# Patient Record
Sex: Male | Born: 1985 | Race: White | Hispanic: No | Marital: Single | Smoking: Former smoker
Health system: Southern US, Community
[De-identification: ages and names within clinical notes are randomized; demographics above are authoritative.]

## PROBLEM LIST (undated history)

## (undated) HISTORY — PX: OTHER SURGICAL HISTORY: SHX169

---
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
## 2012-04-01 DIAGNOSIS — S21139A Puncture wound without foreign body of unspecified front wall of thorax without penetration into thoracic cavity, initial encounter: Secondary | ICD-10-CM | POA: Insufficient documentation

## 2012-04-01 DIAGNOSIS — S51012A Laceration without foreign body of left elbow, initial encounter: Secondary | ICD-10-CM | POA: Insufficient documentation

## 2012-04-03 DIAGNOSIS — F1210 Cannabis abuse, uncomplicated: Secondary | ICD-10-CM | POA: Insufficient documentation

## 2012-04-03 DIAGNOSIS — T07XXXA Unspecified multiple injuries, initial encounter: Secondary | ICD-10-CM | POA: Insufficient documentation

## 2012-04-03 DIAGNOSIS — D62 Acute posthemorrhagic anemia: Secondary | ICD-10-CM | POA: Insufficient documentation

## 2014-09-26 ENCOUNTER — Emergency Department (HOSPITAL_COMMUNITY)
Admission: EM | Admit: 2014-09-26 | Discharge: 2014-09-26 | Disposition: A | Discharge status: Home / Self Care | Payer: Self-pay | Attending: Emergency Medicine | Admitting: Emergency Medicine

## 2014-09-26 ENCOUNTER — Emergency Department (HOSPITAL_COMMUNITY): Payer: Self-pay

## 2014-09-26 ENCOUNTER — Encounter (HOSPITAL_COMMUNITY): Payer: Self-pay | Admitting: Emergency Medicine

## 2014-09-26 DIAGNOSIS — S31119A Laceration without foreign body of abdominal wall, unspecified quadrant without penetration into peritoneal cavity, initial encounter: Secondary | ICD-10-CM

## 2014-09-26 DIAGNOSIS — S31110A Laceration without foreign body of abdominal wall, right upper quadrant without penetration into peritoneal cavity, initial encounter: Secondary | ICD-10-CM | POA: Insufficient documentation

## 2014-09-26 DIAGNOSIS — F1910 Other psychoactive substance abuse, uncomplicated: Secondary | ICD-10-CM

## 2014-09-26 DIAGNOSIS — Z23 Encounter for immunization: Secondary | ICD-10-CM | POA: Insufficient documentation

## 2014-09-26 DIAGNOSIS — Y9389 Activity, other specified: Secondary | ICD-10-CM | POA: Insufficient documentation

## 2014-09-26 DIAGNOSIS — Z720 Tobacco use: Secondary | ICD-10-CM | POA: Insufficient documentation

## 2014-09-26 DIAGNOSIS — W270XXA Contact with workbench tool, initial encounter: Secondary | ICD-10-CM | POA: Insufficient documentation

## 2014-09-26 DIAGNOSIS — Y929 Unspecified place or not applicable: Secondary | ICD-10-CM | POA: Insufficient documentation

## 2014-09-26 DIAGNOSIS — Y998 Other external cause status: Secondary | ICD-10-CM | POA: Insufficient documentation

## 2014-09-26 LAB — RAPID URINE DRUG SCREEN, HOSP PERFORMED
Amphetamines: POSITIVE — AB
BARBITURATES: NOT DETECTED
Benzodiazepines: NOT DETECTED
Cocaine: NOT DETECTED
Opiates: POSITIVE — AB
Tetrahydrocannabinol: POSITIVE — AB

## 2014-09-26 LAB — PREPARE FRESH FROZEN PLASMA
Unit division: 0
Unit division: 0

## 2014-09-26 MED ORDER — ACETAMINOPHEN 500 MG PO TABS
1000.0000 mg | ORAL_TABLET | Freq: Once | ORAL | Status: AC
  Administered 2014-09-26: 1000 mg via ORAL
  Filled 2014-09-26: qty 2

## 2014-09-26 MED ORDER — SODIUM CHLORIDE 0.9 % IV BOLUS (SEPSIS)
1000.0000 mL | Freq: Once | INTRAVENOUS | Status: AC
  Administered 2014-09-26: 1000 mL via INTRAVENOUS

## 2014-09-26 MED ORDER — TETANUS-DIPHTH-ACELL PERTUSSIS 5-2.5-18.5 LF-MCG/0.5 IM SUSP
0.5000 mL | Freq: Once | INTRAMUSCULAR | Status: AC
  Administered 2014-09-26: 0.5 mL via INTRAMUSCULAR
  Filled 2014-09-26: qty 0.5
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
## 2014-09-26 NOTE — ED Notes (Signed)
Pt a/o x 4 on d/c with family. 

## 2014-09-26 NOTE — Discharge Instructions (Signed)

## 2014-09-26 NOTE — ED Notes (Signed)
Pt. Awoken to request urine sample. Pt. States that he is unable to pee at this time, pt. Is refusing use of in-and-out catheter at this time. MD made aware.
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
## 2015-08-26 DIAGNOSIS — T424X2A Poisoning by benzodiazepines, intentional self-harm, initial encounter: Secondary | ICD-10-CM | POA: Insufficient documentation

## 2016-06-14 ENCOUNTER — Encounter (HOSPITAL_COMMUNITY): Payer: Self-pay

## 2016-06-14 ENCOUNTER — Emergency Department (HOSPITAL_COMMUNITY): Payer: Self-pay

## 2016-06-14 ENCOUNTER — Encounter (HOSPITAL_COMMUNITY)
Admission: EM | Disposition: A | Discharge status: Home / Self Care | Payer: Self-pay | Source: Home / Self Care | Attending: Orthopedic Surgery

## 2016-06-14 ENCOUNTER — Observation Stay (HOSPITAL_COMMUNITY)
Admission: EM | Admit: 2016-06-14 | Discharge: 2016-06-15 | Disposition: A | Discharge status: Home / Self Care | Payer: Self-pay | Attending: Orthopedic Surgery | Admitting: Orthopedic Surgery

## 2016-06-14 ENCOUNTER — Emergency Department (HOSPITAL_COMMUNITY): Payer: Self-pay | Admitting: Critical Care Medicine

## 2016-06-14 DIAGNOSIS — S65112A Laceration of radial artery at wrist and hand level of left arm, initial encounter: Secondary | ICD-10-CM | POA: Insufficient documentation

## 2016-06-14 DIAGNOSIS — Z23 Encounter for immunization: Secondary | ICD-10-CM | POA: Insufficient documentation

## 2016-06-14 DIAGNOSIS — S61412A Laceration without foreign body of left hand, initial encounter: Principal | ICD-10-CM | POA: Insufficient documentation

## 2016-06-14 DIAGNOSIS — Y998 Other external cause status: Secondary | ICD-10-CM | POA: Insufficient documentation

## 2016-06-14 DIAGNOSIS — R05 Cough: Secondary | ICD-10-CM | POA: Insufficient documentation

## 2016-06-14 DIAGNOSIS — F17200 Nicotine dependence, unspecified, uncomplicated: Secondary | ICD-10-CM | POA: Insufficient documentation

## 2016-06-14 DIAGNOSIS — S64498A Injury of digital nerve of other finger, initial encounter: Secondary | ICD-10-CM | POA: Insufficient documentation

## 2016-06-14 DIAGNOSIS — Y92009 Unspecified place in unspecified non-institutional (private) residence as the place of occurrence of the external cause: Secondary | ICD-10-CM | POA: Insufficient documentation

## 2016-06-14 DIAGNOSIS — Y93E9 Activity, other interior property and clothing maintenance: Secondary | ICD-10-CM | POA: Insufficient documentation

## 2016-06-14 DIAGNOSIS — W25XXXA Contact with sharp glass, initial encounter: Secondary | ICD-10-CM | POA: Insufficient documentation

## 2016-06-14 HISTORY — PX: NERVE, TENDON AND ARTERY REPAIR: SHX5695

## 2016-06-14 LAB — CBC WITH DIFFERENTIAL/PLATELET
BASOS ABS: 0 10*3/uL (ref 0.0–0.1)
Basophils Relative: 0 %
EOS PCT: 3 %
Eosinophils Absolute: 0.2 10*3/uL (ref 0.0–0.7)
HCT: 33.5 % — ABNORMAL LOW (ref 39.0–52.0)
Hemoglobin: 11.2 g/dL — ABNORMAL LOW (ref 13.0–17.0)
LYMPHS PCT: 43 %
Lymphs Abs: 3.5 10*3/uL (ref 0.7–4.0)
MCH: 29.6 pg (ref 26.0–34.0)
MCHC: 33.4 g/dL (ref 30.0–36.0)
MCV: 88.4 fL (ref 78.0–100.0)
MONO ABS: 0.7 10*3/uL (ref 0.1–1.0)
MONOS PCT: 9 %
Neutro Abs: 3.7 10*3/uL (ref 1.7–7.7)
Neutrophils Relative %: 45 %
PLATELETS: 203 10*3/uL (ref 150–400)
RBC: 3.79 MIL/uL — ABNORMAL LOW (ref 4.22–5.81)
RDW: 12.7 % (ref 11.5–15.5)
WBC: 8.2 10*3/uL (ref 4.0–10.5)

## 2016-06-14 LAB — BASIC METABOLIC PANEL
Anion gap: 6 (ref 5–15)
BUN: 5 mg/dL — AB (ref 6–20)
CALCIUM: 9 mg/dL (ref 8.9–10.3)
CO2: 30 mmol/L (ref 22–32)
Chloride: 102 mmol/L (ref 101–111)
Creatinine, Ser: 0.72 mg/dL (ref 0.61–1.24)
GFR calc Af Amer: >60 mL/min (ref >60)
GLUCOSE: 88 mg/dL (ref 65–99)
POTASSIUM: 4.1 mmol/L (ref 3.5–5.1)
Sodium: 138 mmol/L (ref 135–145)

## 2016-06-14 SURGERY — NERVE, TENDON AND ARTERY REPAIR
Anesthesia: General | Site: Hand | Laterality: Left

## 2016-06-14 MED ORDER — BUPIVACAINE HCL (PF) 0.25 % IJ SOLN
INTRAMUSCULAR | Status: AC
  Filled 2016-06-14: qty 30

## 2016-06-14 MED ORDER — ONDANSETRON HCL 4 MG/2ML IJ SOLN
INTRAMUSCULAR | Status: DC | PRN
  Administered 2016-06-14: 4 mg via INTRAVENOUS

## 2016-06-14 MED ORDER — ONDANSETRON HCL 4 MG/2ML IJ SOLN: 4.0000 mg | Freq: Four times a day (QID) | INTRAMUSCULAR | Status: DC | PRN

## 2016-06-14 MED ORDER — 0.9 % SODIUM CHLORIDE (POUR BTL) OPTIME
TOPICAL | Status: DC | PRN
  Administered 2016-06-14: 1000 mL

## 2016-06-14 MED ORDER — SENNA 8.6 MG PO TABS
1.0000 | ORAL_TABLET | Freq: Two times a day (BID) | ORAL | Status: DC
  Administered 2016-06-15: 8.6 mg via ORAL
  Filled 2016-06-14 (×2): qty 1

## 2016-06-14 MED ORDER — OXYCODONE HCL 5 MG PO TABS: 5.0000 mg | ORAL_TABLET | Freq: Once | ORAL | Status: DC | PRN

## 2016-06-14 MED ORDER — FENTANYL CITRATE (PF) 100 MCG/2ML IJ SOLN
INTRAMUSCULAR | Status: AC
  Filled 2016-06-14: qty 2

## 2016-06-14 MED ORDER — LACTATED RINGERS IV SOLN
INTRAVENOUS | Status: DC | PRN
  Administered 2016-06-14 (×3): via INTRAVENOUS

## 2016-06-14 MED ORDER — OXYCODONE HCL 5 MG/5ML PO SOLN: 5.0000 mg | Freq: Once | ORAL | Status: DC | PRN

## 2016-06-14 MED ORDER — INFLUENZA VAC SPLIT QUAD 0.5 ML IM SUSY
0.5000 mL | PREFILLED_SYRINGE | INTRAMUSCULAR | Status: AC
  Administered 2016-06-15: 0.5 mL via INTRAMUSCULAR
  Filled 2016-06-14: qty 0.5

## 2016-06-14 MED ORDER — ALPRAZOLAM 0.5 MG PO TABS
0.5000 mg | ORAL_TABLET | Freq: Four times a day (QID) | ORAL | Status: DC | PRN
  Administered 2016-06-15: 0.5 mg via ORAL
  Filled 2016-06-14: qty 1

## 2016-06-14 MED ORDER — DEXAMETHASONE SODIUM PHOSPHATE 4 MG/ML IJ SOLN
INTRAMUSCULAR | Status: DC | PRN
  Administered 2016-06-14: 10 mg via INTRAVENOUS

## 2016-06-14 MED ORDER — MIDAZOLAM HCL 2 MG/2ML IJ SOLN
INTRAMUSCULAR | Status: AC
  Filled 2016-06-14: qty 2

## 2016-06-14 MED ORDER — DEXAMETHASONE SODIUM PHOSPHATE 10 MG/ML IJ SOLN
INTRAMUSCULAR | Status: AC
  Filled 2016-06-14: qty 1

## 2016-06-14 MED ORDER — METHOCARBAMOL 500 MG PO TABS: 500.0000 mg | ORAL_TABLET | Freq: Four times a day (QID) | ORAL | Status: DC | PRN

## 2016-06-14 MED ORDER — PROPOFOL 10 MG/ML IV BOLUS
INTRAVENOUS | Status: DC | PRN
  Administered 2016-06-14: 200 mg via INTRAVENOUS

## 2016-06-14 MED ORDER — DEXTROSE 5 % IV SOLN
500.0000 mg | Freq: Four times a day (QID) | INTRAVENOUS | Status: DC | PRN
  Administered 2016-06-15: 500 mg via INTRAVENOUS
  Filled 2016-06-14: qty 5

## 2016-06-14 MED ORDER — ONDANSETRON HCL 4 MG/2ML IJ SOLN: 4.0000 mg | Freq: Once | INTRAMUSCULAR | Status: DC | PRN

## 2016-06-14 MED ORDER — MORPHINE SULFATE (PF) 2 MG/ML IV SOLN
1.0000 mg | INTRAVENOUS | Status: DC | PRN
  Administered 2016-06-14 – 2016-06-15 (×2): 1 mg via INTRAVENOUS
  Filled 2016-06-14 (×2): qty 1

## 2016-06-14 MED ORDER — CEFAZOLIN SODIUM-DEXTROSE 2-3 GM-% IV SOLR
INTRAVENOUS | Status: DC | PRN
  Administered 2016-06-14: 2 g via INTRAVENOUS

## 2016-06-14 MED ORDER — SODIUM CHLORIDE 0.45 % IV SOLN
INTRAVENOUS | Status: DC
  Administered 2016-06-14 – 2016-06-15 (×2): via INTRAVENOUS

## 2016-06-14 MED ORDER — CEFAZOLIN IN D5W 1 GM/50ML IV SOLN
1.0000 g | Freq: Three times a day (TID) | INTRAVENOUS | Status: DC
  Administered 2016-06-15: 1 g via INTRAVENOUS
  Filled 2016-06-14 (×3): qty 50

## 2016-06-14 MED ORDER — SODIUM CHLORIDE 0.9 % IR SOLN
Status: DC | PRN
  Administered 2016-06-14: 3000 mL

## 2016-06-14 MED ORDER — MORPHINE SULFATE (PF) 4 MG/ML IV SOLN
4.0000 mg | INTRAVENOUS | Status: DC | PRN
  Administered 2016-06-14 – 2016-06-15 (×4): 4 mg via INTRAVENOUS
  Filled 2016-06-14 (×4): qty 1

## 2016-06-14 MED ORDER — ONDANSETRON HCL 4 MG/2ML IJ SOLN
4.0000 mg | Freq: Once | INTRAMUSCULAR | Status: AC
  Administered 2016-06-14: 4 mg via INTRAVENOUS
  Filled 2016-06-14: qty 2

## 2016-06-14 MED ORDER — PANTOPRAZOLE SODIUM 40 MG PO TBEC: 40.0000 mg | DELAYED_RELEASE_TABLET | Freq: Two times a day (BID) | ORAL | Status: DC | PRN

## 2016-06-14 MED ORDER — PROMETHAZINE HCL 25 MG RE SUPP: 12.5000 mg | Freq: Four times a day (QID) | RECTAL | Status: DC | PRN

## 2016-06-14 MED ORDER — FENTANYL CITRATE (PF) 100 MCG/2ML IJ SOLN
INTRAMUSCULAR | Status: DC | PRN
  Administered 2016-06-14: 25 ug via INTRAVENOUS
  Administered 2016-06-14: 50 ug via INTRAVENOUS
  Administered 2016-06-14 (×5): 25 ug via INTRAVENOUS

## 2016-06-14 MED ORDER — CEFAZOLIN IN D5W 1 GM/50ML IV SOLN
INTRAVENOUS | Status: AC
  Filled 2016-06-14: qty 50

## 2016-06-14 MED ORDER — ONDANSETRON HCL 4 MG PO TABS: 4.0000 mg | ORAL_TABLET | Freq: Four times a day (QID) | ORAL | Status: DC | PRN

## 2016-06-14 MED ORDER — MIDAZOLAM HCL 5 MG/5ML IJ SOLN
INTRAMUSCULAR | Status: DC | PRN
  Administered 2016-06-14: 2 mg via INTRAVENOUS

## 2016-06-14 MED ORDER — HYDROMORPHONE HCL 1 MG/ML IJ SOLN
0.2500 mg | INTRAMUSCULAR | Status: DC | PRN
  Administered 2016-06-14 (×4): 0.5 mg via INTRAVENOUS

## 2016-06-14 MED ORDER — LIDOCAINE HCL (CARDIAC) 10 MG/ML IV SOLN
INTRAVENOUS | Status: DC | PRN
  Administered 2016-06-14: 60 mg via INTRAVENOUS

## 2016-06-14 MED ORDER — CEFAZOLIN IN D5W 1 GM/50ML IV SOLN
1.0000 g | INTRAVENOUS | Status: AC
  Administered 2016-06-14: 1 g via INTRAVENOUS
  Filled 2016-06-14: qty 50

## 2016-06-14 MED ORDER — ONDANSETRON HCL 4 MG/2ML IJ SOLN
INTRAMUSCULAR | Status: AC
  Filled 2016-06-14: qty 2

## 2016-06-14 MED ORDER — HYDROMORPHONE HCL 2 MG/ML IJ SOLN
INTRAMUSCULAR | Status: AC
  Filled 2016-06-14: qty 1

## 2016-06-14 MED ORDER — OXYCODONE HCL 5 MG PO TABS
5.0000 mg | ORAL_TABLET | ORAL | Status: DC | PRN
  Administered 2016-06-15 (×2): 10 mg via ORAL
  Filled 2016-06-14 (×2): qty 2

## 2016-06-14 SURGICAL SUPPLY — 59 items
BANDAGE ACE 3X5.8 VEL STRL LF (GAUZE/BANDAGES/DRESSINGS) ×3 IMPLANT
BANDAGE ACE 4X5 VEL STRL LF (GAUZE/BANDAGES/DRESSINGS) ×3 IMPLANT
BANDAGE ELASTIC 3 VELCRO ST LF (GAUZE/BANDAGES/DRESSINGS) IMPLANT
BNDG COHESIVE 1X5 TAN STRL LF (GAUZE/BANDAGES/DRESSINGS) IMPLANT
BNDG CONFORM 3 STRL LF (GAUZE/BANDAGES/DRESSINGS) ×3 IMPLANT
BNDG GAUZE ELAST 4 BULKY (GAUZE/BANDAGES/DRESSINGS) ×3 IMPLANT
CORDS BIPOLAR (ELECTRODE) ×3 IMPLANT
COVER SURGICAL LIGHT HANDLE (MISCELLANEOUS) ×3 IMPLANT
CUFF TOURNIQUET SINGLE 18IN (TOURNIQUET CUFF) ×3 IMPLANT
CUFF TOURNIQUET SINGLE 24IN (TOURNIQUET CUFF) IMPLANT
DECANTER SPIKE VIAL GLASS SM (MISCELLANEOUS) IMPLANT
DRAPE SURG 17X23 STRL (DRAPES) ×3 IMPLANT
DRSG ADAPTIC 3X8 NADH LF (GAUZE/BANDAGES/DRESSINGS) ×3 IMPLANT
GAUZE SPONGE 2X2 8PLY STRL LF (GAUZE/BANDAGES/DRESSINGS) IMPLANT
GAUZE SPONGE 4X4 12PLY STRL (GAUZE/BANDAGES/DRESSINGS) ×3 IMPLANT
GAUZE SPONGE 4X4 16PLY XRAY LF (GAUZE/BANDAGES/DRESSINGS) ×3 IMPLANT
GAUZE XEROFORM 1X8 LF (GAUZE/BANDAGES/DRESSINGS) IMPLANT
GAUZE XEROFORM 5X9 LF (GAUZE/BANDAGES/DRESSINGS) ×3 IMPLANT
GLOVE BIOGEL M 8.0 STRL (GLOVE) IMPLANT
GLOVE SS BIOGEL STRL SZ 8 (GLOVE) ×1 IMPLANT
GLOVE SUPERSENSE BIOGEL SZ 8 (GLOVE) ×2
GOWN STRL REUS W/ TWL LRG LVL3 (GOWN DISPOSABLE) ×2 IMPLANT
GOWN STRL REUS W/ TWL XL LVL3 (GOWN DISPOSABLE) ×1 IMPLANT
GOWN STRL REUS W/TWL LRG LVL3 (GOWN DISPOSABLE) ×4
GOWN STRL REUS W/TWL XL LVL3 (GOWN DISPOSABLE) ×2
GUIDE NERVE NEURAGEN 6MM (Tissue) ×1 IMPLANT
KIT BASIN OR (CUSTOM PROCEDURE TRAY) ×3 IMPLANT
KIT ROOM TURNOVER OR (KITS) ×3 IMPLANT
LOOP VESSEL MAXI BLUE (MISCELLANEOUS) IMPLANT
MANIFOLD NEPTUNE II (INSTRUMENTS) ×3 IMPLANT
NEEDLE HYPO 25GX1X1/2 BEV (NEEDLE) ×3 IMPLANT
NERVE GUIDE NEURAGEN 6MM (Tissue) ×3 IMPLANT
NS IRRIG 1000ML POUR BTL (IV SOLUTION) ×3 IMPLANT
PACK ORTHO EXTREMITY (CUSTOM PROCEDURE TRAY) ×3 IMPLANT
PAD ARMBOARD 7.5X6 YLW CONV (MISCELLANEOUS) ×6 IMPLANT
PAD CAST 3X4 CTTN HI CHSV (CAST SUPPLIES) ×1 IMPLANT
PAD CAST 4YDX4 CTTN HI CHSV (CAST SUPPLIES) ×1 IMPLANT
PADDING CAST COTTON 3X4 STRL (CAST SUPPLIES) ×2
PADDING CAST COTTON 4X4 STRL (CAST SUPPLIES) ×2
SET CYSTO W/LG BORE CLAMP LF (SET/KITS/TRAYS/PACK) ×3 IMPLANT
SOLUTION BETADINE 4OZ (MISCELLANEOUS) ×3 IMPLANT
SPEAR EYE SURG WECK-CEL (MISCELLANEOUS) ×3 IMPLANT
SPECIMEN JAR SMALL (MISCELLANEOUS) IMPLANT
SPLINT FIBERGLASS 4X30 (CAST SUPPLIES) ×3 IMPLANT
SPONGE GAUZE 2X2 STER 10/PKG (GAUZE/BANDAGES/DRESSINGS)
SPONGE SCRUB IODOPHOR (GAUZE/BANDAGES/DRESSINGS) ×3 IMPLANT
SUT ETHILON 8 0 BV130 4 (SUTURE) ×9 IMPLANT
SUT MERSILENE 4 0 P 3 (SUTURE) IMPLANT
SUT PROLENE 4 0 PS 2 18 (SUTURE) ×12 IMPLANT
SUT VIC AB 2-0 CT1 27 (SUTURE)
SUT VIC AB 2-0 CT1 TAPERPNT 27 (SUTURE) IMPLANT
SYR CONTROL 10ML LL (SYRINGE) ×3 IMPLANT
SYSTEM CHEST DRAIN TLS 7FR (DRAIN) ×3 IMPLANT
TOWEL OR 17X24 6PK STRL BLUE (TOWEL DISPOSABLE) ×3 IMPLANT
TOWEL OR 17X26 10 PK STRL BLUE (TOWEL DISPOSABLE) ×3 IMPLANT
TUBE CONNECTING 12'X1/4 (SUCTIONS) ×1
TUBE CONNECTING 12X1/4 (SUCTIONS) ×2 IMPLANT
UNDERPAD 30X30 (UNDERPADS AND DIAPERS) ×3 IMPLANT
WATER STERILE IRR 1000ML POUR (IV SOLUTION) ×3 IMPLANT

## 2016-06-14 NOTE — ED Notes (Signed)
Surgeon at bedside.  
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
## 2016-06-14 NOTE — Op Note (Signed)
[phone]  Procedure #1 Status post irrigation debridement skin, subcutaneous tissue,  Muscle,  tendon and associated neurovascular structure left hand #2 open carpal tunnel release left hand #3 common digital nerve neuro lysis to the second webspace with noted contusive injury #4 radial and ulnar digital nerve repair to the thumb at the palm level #5 median nerve neuro lysis #6 rotation flap closure left hand #7 exploration superficial palmar arch with tying off of radial digital artery to the thumb vascular leash #8 radial digital nerve to the index finger repair  Surgeon Dominica SeverinWilliam Erian Rosengren Asst. Karie ChimeraBrian Buchanan PA-C  We will plan for IV antibiotics elevation general postop observation

## 2016-06-14 NOTE — Transfer of Care (Signed)
Immediate Anesthesia Transfer of Care Note  Patient: Timothy DagoEric Gray  Procedure(s) Performed: Procedure(s): NERVE, TENDON AND ARTERY REPAIR/ I & D (Left)  Patient Location: PACU  Anesthesia Type:General  Level of Consciousness: awake  Airway & Oxygen Therapy: Patient Spontanous Breathing  Post-op Assessment: Report given to RN and Post -op Vital signs reviewed and stable  Post vital signs: Reviewed and stable  Last Vitals:  Vitals:   06/14/16 1215 06/14/16 1300  BP: 106/61 107/55  Pulse: 77 75  Resp: 15 16  Temp:      Last Pain:  Vitals:   06/14/16 1243  TempSrc:   PainSc: Asleep         Complications: No apparent anesthesia complications

## 2016-06-14 NOTE — Anesthesia Preprocedure Evaluation (Signed)
Anesthesia Evaluation  Patient identified by MRN, date of birth, ID band Patient awake    Reviewed: Allergy & Precautions, NPO status , Patient's Chart, lab work & pertinent test results  Airway Mallampati: II  TM Distance: >3 FB Neck ROM: Full    Dental  (+) Teeth Intact, Dental Advisory Given   Pulmonary Current Smoker,    breath sounds clear to auscultation       Cardiovascular  Rhythm:Regular Rate:Normal     Neuro/Psych    GI/Hepatic   Endo/Other    Renal/GU      Musculoskeletal   Abdominal   Peds  Hematology   Anesthesia Other Findings   Reproductive/Obstetrics                             Anesthesia Physical Anesthesia Plan  ASA: II and emergent  Anesthesia Plan: General   Post-op Pain Management:    Induction: Intravenous  Airway Management Planned: LMA  Additional Equipment:   Intra-op Plan:   Post-operative Plan:   Informed Consent: I have reviewed the patients History and Physical, chart, labs and discussed the procedure including the risks, benefits and alternatives for the proposed anesthesia with the patient or authorized representative who has indicated his/her understanding and acceptance.   Dental advisory given  Plan Discussed with: CRNA and Anesthesiologist  Anesthesia Plan Comments:         Anesthesia Quick Evaluation

## 2016-06-14 NOTE — ED Notes (Signed)
Patient taken to xray.

## 2016-06-14 NOTE — ED Notes (Signed)
Pt has good radial pulse can wiggle fingers can feel touch and nails have  Good blanch < 3 sec
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
## 2016-06-14 NOTE — ED Notes (Signed)
Dr Amanda PeaGramig down to see pt and speak to PA Abby, permit signed and pt to xray

## 2016-06-14 NOTE — Anesthesia Postprocedure Evaluation (Signed)
Anesthesia Post Note  Patient: Timothy Gray  Procedure(s) Performed: Procedure(s) (LRB): NERVE, TENDON AND ARTERY REPAIR/ I & D (Left)  Patient location during evaluation: PACU Anesthesia Type: General Level of consciousness: awake, awake and alert and oriented Pain management: pain level controlled Vital Signs Assessment: post-procedure vital signs reviewed and stable Respiratory status: spontaneous breathing, nonlabored ventilation and respiratory function stable Cardiovascular status: tachycardic Anesthetic complications: no    Last Vitals:  Vitals:   06/14/16 1702 06/14/16 1742  BP: 109/75 109/63  Pulse: 72   Resp: 12   Temp: 36.6 C 36.8 C    Last Pain:  Vitals:   06/14/16 1836  TempSrc:   PainSc: 7                  Natajah Derderian COKER

## 2016-06-14 NOTE — ED Triage Notes (Signed)
Patient instructed to come to ED to be evaluated by Gramig for left hand laceration. Seen at [facility] ED early Saturday am and here for possible surgical repair. Did have percocet this am 0730. Arrived with dressing to same

## 2016-06-14 NOTE — Anesthesia Procedure Notes (Signed)
Procedure Name: LMA Insertion Date/Time: 06/14/2016 2:09 PM Performed by: Glo HerringLEE, Maridel Pixler B Pre-anesthesia Checklist: Emergency Drugs available, Suction available, Patient identified, Patient being monitored and Timeout performed Patient Re-evaluated:Patient Re-evaluated prior to inductionOxygen Delivery Method: Circle system utilized Preoxygenation: Pre-oxygenation with 100% oxygen Intubation Type: IV induction LMA: LMA inserted LMA Size: 5.0 Number of attempts: 1 Placement Confirmation: positive ETCO2,  CO2 detector and breath sounds checked- equal and bilateral Tube secured with: Tape Dental Injury: Teeth and Oropharynx as per pre-operative assessment

## 2016-06-15 ENCOUNTER — Encounter (HOSPITAL_COMMUNITY): Payer: Self-pay | Admitting: Orthopedic Surgery

## 2016-06-15 MED ORDER — SULFAMETHOXAZOLE-TRIMETHOPRIM 800-160 MG PO TABS: 1.0000 | ORAL_TABLET | Freq: Two times a day (BID) | ORAL | 0 refills | Status: DC

## 2016-06-15 MED ORDER — OXYCODONE HCL 5 MG PO TABS: 10.0000 mg | ORAL_TABLET | ORAL | 0 refills | Status: DC | PRN

## 2016-06-15 NOTE — Progress Notes (Signed)
PT Cancellation Note  Patient Details Name: Timothy Gray MRN: [id number] DOB: 08/03/1985   Cancelled Treatment:    Reason Eval/Treat Not Completed: Other (comment) (Pt at door of room, states d/c complete) Checked with nurse, d/c status confirmed. Unable to see.   Gaye PollackRebecca Francie Keeling 06/15/2016, 8:54 AM Gaye Pollackebecca Jaedin Trumbo, SPT [phone]

## 2016-06-15 NOTE — Discharge Summary (Signed)
Physician Discharge Summary  Patient ID: Timothy Gray MRN: [id number] DOB/AGE: 31/10/1985 [age] y.o.  Admit date: 06/14/2016 Discharge date:   Admission Diagnoses: left Hand Laceration History reviewed. No pertinent past medical history.  Discharge Diagnoses:  Active Problems:   Laceration of left hand with complication   Surgeries: Procedure(s): NERVE, TENDON AND ARTERY REPAIR/ I & D on 06/14/2016    Consultants:   Discharged Condition: Improved  Hospital Course: Timothy Dagoric Treadway is an 30 y.o. male who was admitted 06/14/2016 with a chief complaint of No chief complaint on file. , and found to have a diagnosis of left Hand Laceration.  They were brought to the operating room on 06/14/2016 and underwent Procedure(s): NERVE, TENDON AND ARTERY REPAIR/ I & D.    They were given perioperative antibiotics: Anti-infectives    Start     Dose/Rate Route Frequency Ordered Stop   06/15/16 0000  ceFAZolin (ANCEF) IVPB 1 g/50 mL premix     1 g 100 mL/hr over 30 Minutes Intravenous Every 8 hours 06/14/16 1732     06/15/16 0000  sulfamethoxazole-trimethoprim (BACTRIM DS,SEPTRA DS) 800-160 MG tablet     1 tablet Oral 2 times daily 06/15/16 0722     06/14/16 1645  ceFAZolin (ANCEF) IVPB 1 g/50 mL premix     1 g 100 mL/hr over 30 Minutes Intravenous NOW 06/14/16 1632 06/14/16 1706   06/14/16 1636  ceFAZolin (ANCEF) 1 GM/50ML IVPB    Comments:  Sofie RowerGordon, Rebecca   : cabinet override      06/14/16 1636 06/15/16 0444    .  They were given sequential compression devices, early ambulation, and  prophylaxis.  Recent vital signs: Patient Vitals for the past 24 hrs:  BP Temp Temp src Pulse Resp SpO2 Height Weight  06/15/16 0600 (!) 94/46 98.4 F (36.9 C) Oral 68 - 97 % - -  06/14/16 2036 (!) 104/56 98.9 F (37.2 C) Oral 95 - 97 % - -  06/14/16 1742 109/63 98.3 F (36.8 C) Oral - - 96 % 5\' 11"  (1.803 m) 71.8 kg (158 lb 3.2 oz)  06/14/16 1702 109/75 97.8 F (36.6 C) - 72 12 95 % - -  06/14/16 1647  108/71 - - 72 14 96 % - -  06/14/16 1632 109/70 - - 84 11 94 % - -  06/14/16 1617 124/70 97.9 F (36.6 C) - 98 (!) 9 96 % - -  06/14/16 1300 107/55 - - 75 16 100 % - -  06/14/16 1215 106/61 - - 77 15 100 % - -  06/14/16 1049 106/60 - - 84 13 100 % - -  06/14/16 1030 106/60 - - - - - - -  06/14/16 0915 100/65 - - 81 - 100 % - -  06/14/16 0845 117/71 - - 94 - 100 % - -  06/14/16 0826 107/69 98.1 F (36.7 C) Oral 103 20 100 % - 68 kg (150 lb)  .  Recent laboratory studies: Dg Chest 2 View  Result Date: 06/14/2016 CLINICAL DATA:  Cough.  Preoperative study. EXAM: CHEST  2 VIEW COMPARISON:  July 17, 2015 FINDINGS: The heart size and mediastinal contours are within normal limits. Both lungs are clear. The visualized skeletal structures are unremarkable. IMPRESSION: No active cardiopulmonary disease. Electronically Signed   By: Gerome Samavid  Williams III M.D   On: 06/14/2016 12:10    Discharge Medications:     Medication List    STOP taking these medications   naproxen 500 MG tablet Commonly  known as:  NAPROSYN   oxyCODONE-acetaminophen 7.5-325 MG tablet Commonly known as:  PERCOCET     TAKE these medications   acetaminophen 500 MG tablet Commonly known as:  TYLENOL Take 500 mg by mouth every 6 (six) hours as needed for mild pain.   oxyCODONE 5 MG immediate release tablet Commonly known as:  Oxy IR/ROXICODONE Take 2 tablets (10 mg total) by mouth every 4 (four) hours as needed for moderate pain.   sulfamethoxazole-trimethoprim 800-160 MG tablet Commonly known as:  BACTRIM DS,SEPTRA DS Take 1 tablet by mouth 2 (two) times daily.       Diagnostic Studies: Dg Chest 2 View  Result Date: 06/14/2016 CLINICAL DATA:  Cough.  Preoperative study. EXAM: CHEST  2 VIEW COMPARISON:  July 17, 2015 FINDINGS: The heart size and mediastinal contours are within normal limits. Both lungs are clear. The visualized skeletal structures are unremarkable. IMPRESSION: No active cardiopulmonary disease.  Electronically Signed   By: Gerome Samavid  Williams III M.D   On: 06/14/2016 12:10    They benefited maximally from their hospital stay and there were no complications.     Disposition: 01-Home or Self Care Discharge Instructions    Call MD / Call 911    Complete by:  As directed    If you experience chest pain or shortness of breath, CALL 911 and be transported to the hospital emergency room.  If you develope a fever above 101 F, pus (white drainage) or increased drainage or redness at the wound, or calf pain, call your surgeon's office.   Constipation Prevention    Complete by:  As directed    Drink plenty of fluids.  Prune juice may be helpful.  You may use a stool softener, such as Colace (over the counter) 100 mg twice a day.  Use MiraLax (over the counter) for constipation as needed.   Diet - low sodium heart healthy    Complete by:  As directed    Increase activity slowly as tolerated    Complete by:  As directed      Follow-up Information    [name],Fayrene Towner M, MD Follow up in 14 day(s).   Specialty:  Orthopedic Surgery Contact information: [address] [place] [address] [phone]            Signed: [name],Pasqualina Colasurdo M 06/15/2016, 7:22 AM

## 2016-06-15 NOTE — Op Note (Signed)
NAME:  Timothy Gray, Timothy Gray              ACCOUNT NO.:  [id number]  MEDICAL RECORD NO.:  [id number]  LOCATION:  [facility]                         FACILITY:  [facility]  PHYSICIAN:  Dionne AnoWilliam M. Gray Sarli, M.D.DATE OF BIRTH:  12/01/1985  DATE OF PROCEDURE:06/14/2016 Timothy BoucheDATE OF DISCHARGE:TBD                              OPERATIVE REPORT   PREOPERATIVE DIAGNOSIS:  Large laceration, thenar space and hand, left upper extremity with deficit in terms of sensation about the left hand including thumb and index finger.  The patient was noted to have extensive bleeding.  [facility] [place] [facility]  POSTOPERATIVE DIAGNOSIS:  Large laceration, thenar space and hand, left upper extremity with deficit in terms of sensation about the left hand including thumb and index finger.  The patient was noted to have extensive bleeding with noted radial and ulnar digital nerve injury to the thumb at the takeoff of the median nerve, complete second webspace common digital nerve laceration, radial digital artery injury to the thumb with an intact superficial palmar arch after exploration.  [facility] [place] [facility]  SURGICAL PROCEDURE PERFORMED: 1. Irrigation and debridement of skin, subcutaneous tissue, muscle,     tendon, and associated neurovascular structures, left hand     including the palm.  This was an excisional debridement with     curette, knife, blade, and scissor, approximately 9 x 4 cm. 2. Open carpal tunnel release, left upper extremity. 3. Common digital nerve repair to the second webspace with direct     microscopic technique and a nerve tube. 4. Radial and ulnar digital nerve repair to the thumb at the palm     level with a noted intact thenar branch.  Direct repair with nerve     wrap was accomplished. 5. Median nerve neurolysis. 6. Rotation flap closure, left hand. 7. Superficial palmar arch exploration with tying off the radial      digital artery and the patient had a competent blood supply to the     hand. 8. Common digital nerve neurolysis to the second webspace with noted     contusion injury. 9. Repair radial digital nerve to the index finger.  SURGEON:  Dionne AnoWilliam M. Amanda PeaGramig, M.D.  ASSISTANT:  Karie ChimeraBrian Buchanan, PA-C.  COMPLICATIONS:  None.  ANESTHESIA:  General.  INDICATIONS:  This patient is a male 30 years of age, had a glass injury.  [facility]  I spoke with him over the phone and recommended evaluation.  He presented to the emergency room.  He complains of pain, loss of function, and numbness.  I have counseled him regarding risks and benefits surgery including risk of infection, bleeding, anesthesia, damage to normal structures, and failure of surgery to accomplish its intended goals of relieving symptoms and restoring function.  With this in mind, he desires to proceed.  All questions have been encouraged and answered preoperatively.  OPERATIVE PROCEDURE:  The patient was seen by myself and Anesthesia and underwent a very smooth induction of general anesthesia.  He was prepped and draped in usual sterile fashion with Betadine scrub and paint after Hibiclens pre-scrub.  Time-out was observed.  The left upper extremity was carefully padded and tourniquet insufflated.  Following this, I removed prior sutures from the outside facility.  Following this, I removed and got a large amount of Vicryl suture as well as Gel-Foam type material, this had to be completely removed and following removal, I performed I and D of skin, subcutaneous tissue, muscle, tendon, nerve, and associated structures.  This was an excisional debridement with curette, knife, blade, and scissor.  Once this was complete, I placed 3 L through and through the area and got a nice clean working field.  Once this was complete, I then identified the superficial palmar arch. There was a laceration to the radial  digital artery, and I very carefully evaluated this.  The ulnar digital artery to the thumb and the remaining superficial palmar arch looked excellent.  Thus, I tied off the radial digital artery with tourniquet deflated making sure that the patient had good refill and incompetent flow through all fingers as well as the thumb of course.  Once this was complete, I performed extensive median nerve neurolysis in the vicinity of the injury and noted injury to the radial and ulnar digital nerves to the thumb as well as injury to the common digital nerve to the second webspace.  The patient also had a complete laceration to the radial digital nerve to the index finger.  Following this, I noted the take off the median nerve and thoroughly explored it.  It was very apparent that we would need to perform a carpal tunnel release and evaluate the nerve in its entirety.  At this time, I performed an open carpal tunnel release of the ulnar ledge, very carefully released the transcarpal ligament as well as the palmar fascia regions as needed.  The median nerve was intact proximally.  As I performed a median nerve neurolysis extensive in nature, I noted that distally the thenar branches were intact as they exited and also noted that the common digital nerve to the second webspace was bruised and contused, but intact.  This underwent a neurolysis.  I noted that the radial and ulnar digital nerves to the thumb as well as the radial digital nerve to the index finger were completely lacerated.  At this time, I then very carefully and cautiously performed placement of sutures and completed a circumferential epineural repair.  The radial digital nerve to the index finger was repaired with 8-0 nylon with microscopic technique without difficulty.  The radial and ulnar digital nerves to the thumb were similarly repaired with microscopic technique utilizing 8-0 nylon.  The common digital nerve to the second  webspace underwent a neurolysis extensive in nature.  The patient tolerated this well.  Following this, a presoaked nerve tube was then wrapped around the repair sites to serve as an insulation device and hopefully help aid in the recovery process.  Following this, I then did repair the fascia in the corner of the thenar region.  I noted that all looked quite well.  The patient had these repairs done with the tourniquet deflated, and there were no complicating features.  The tourniquet was up for a brief period of time.  The wound was closed with a rotation flap closure.  I made some step back cuts as the patient required some degree of shifting of the thenar skin to get full coverage in the palm.  This with undermining technique and palmar fascia release, I then performed a rotation flap closure without difficulty utilizing Prolene suture.  I  was pleased with this and the findings.  Thus, I and D, open carpal tunnel release, common digital nerve neurolysis to the second webspace, radial and ulnar digital nerve repair to the thumb at the palm level, median nerve neurolysis, right index finger radial digital nerve repair, rotation flap closure, and exploration of the superficial palmar arch with tying off the radial digital artery were accomplished without difficulty.  The patient tolerated this well.  He had good refill, soft pink hand at the conclusion of the case.  There were no complicating features.  We will admit him for IV antibiotics, general postop observation, and other measures.  Should problems arise, he will notify us.  We will be immediately available, otherwise I look forward to seeing him back in the office as outlined once he is ready for discharge.     Dionne Ano. Amanda Pea, M.D.     [facility]  D:  06/14/2016  T:  06/15/2016  Job:  [id number]

## 2016-06-15 NOTE — Progress Notes (Signed)
OT Cancellation Note  Patient Details Name: Timothy Gray MRN: [id number] DOB: 06/25/1986   Cancelled Treatment:    Reason Eval/Treat Not Completed:  (Pt discharged before arrival of therapist.)  Evern BioMayberry, Deniz Eskridge Lynn 06/15/2016, 9:24 AM

## 2016-06-15 NOTE — Discharge Instructions (Signed)
° °  We recommend that you to take vitamin C 1000 mg a day to promote healing.we also rec Vit B6 200 mg a day to promote nerve healing We also recommend that if you require  pain medicine that you take a stool softener to prevent constipation as most pain medicines will have constipation side effects. We recommend either Peri-Colace or Senokot and recommend that you also consider adding MiraLAX as well to prevent the constipation affects from pain medicine if you are required to use them. These medicines are over the counter and may be purchased at a local pharmacy. A cup of yogurt and a probiotic can also be helpful during the recovery process as the medicines can disrupt your intestinal environment. Keep bandage clean and dry.  Call for any problems.  No smoking.  Criteria for driving a car: you should be off your pain medicine for 7-8 hours, able to drive one handed(confident), thinking clearly and feeling able in your judgement to drive. Continue elevation as it will decrease swelling.  If instructed by MD move your fingers within the confines of the bandage/splint.  Use ice if instructed by your MD. Call immediately for any sudden loss of feeling in your hand/arm or change in functional abilities of the extremity.

## 2016-06-15 NOTE — Progress Notes (Signed)
Denyse DagoEric Dorfman to be D/C'd  per MD order. Discussed with the patient and all questions fully answered.  VSS, Skin clean, dry and intact without evidence of skin break down, no evidence of skin tears noted.  IV catheter discontinued intact. Site without signs and symptoms of complications. Dressing and pressure applied.  An After Visit Summary was printed and given to the patient. Patient received prescription.  D/c education completed with patient/family including follow up instructions, medication list, d/c activities limitations if indicated, with other d/c instructions as indicated by MD - patient able to verbalize understanding, all questions fully answered.   Patient instructed to return to ED, call 911, or call MD for any changes in condition.   Patient walking out with visitor, no wheelchair requested, and D/C home via private auto.

## 2016-06-25 DIAGNOSIS — F1510 Other stimulant abuse, uncomplicated: Secondary | ICD-10-CM | POA: Insufficient documentation

## 2016-06-25 DIAGNOSIS — F1310 Sedative, hypnotic or anxiolytic abuse, uncomplicated: Secondary | ICD-10-CM | POA: Insufficient documentation

## 2016-06-25 DIAGNOSIS — F609 Personality disorder, unspecified: Secondary | ICD-10-CM | POA: Insufficient documentation

## 2016-06-25 DIAGNOSIS — T424X4A Poisoning by benzodiazepines, undetermined, initial encounter: Secondary | ICD-10-CM | POA: Insufficient documentation

## 2016-06-25 DIAGNOSIS — F1110 Opioid abuse, uncomplicated: Secondary | ICD-10-CM | POA: Insufficient documentation

## 2017-09-13 ENCOUNTER — Emergency Department (HOSPITAL_COMMUNITY): Payer: No Typology Code available for payment source

## 2017-09-13 ENCOUNTER — Emergency Department (HOSPITAL_COMMUNITY)
Admission: EM | Admit: 2017-09-13 | Discharge: 2017-09-13 | Disposition: A | Discharge status: Home / Self Care | Payer: No Typology Code available for payment source | Attending: Emergency Medicine | Admitting: Emergency Medicine

## 2017-09-13 DIAGNOSIS — Y9389 Activity, other specified: Secondary | ICD-10-CM | POA: Insufficient documentation

## 2017-09-13 DIAGNOSIS — T07XXXA Unspecified multiple injuries, initial encounter: Secondary | ICD-10-CM

## 2017-09-13 DIAGNOSIS — Y929 Unspecified place or not applicable: Secondary | ICD-10-CM | POA: Insufficient documentation

## 2017-09-13 DIAGNOSIS — Y998 Other external cause status: Secondary | ICD-10-CM | POA: Diagnosis not present

## 2017-09-13 DIAGNOSIS — F1210 Cannabis abuse, uncomplicated: Secondary | ICD-10-CM | POA: Diagnosis not present

## 2017-09-13 DIAGNOSIS — F1910 Other psychoactive substance abuse, uncomplicated: Secondary | ICD-10-CM

## 2017-09-13 DIAGNOSIS — R4182 Altered mental status, unspecified: Secondary | ICD-10-CM | POA: Diagnosis present

## 2017-09-13 LAB — COMPREHENSIVE METABOLIC PANEL
ALK PHOS: 50 U/L (ref 38–126)
ALT: 135 U/L — AB (ref 17–63)
AST: 77 U/L — AB (ref 15–41)
Albumin: 3.9 g/dL (ref 3.5–5.0)
Anion gap: 11 (ref 5–15)
BILIRUBIN TOTAL: 1.4 mg/dL — AB (ref 0.3–1.2)
BUN: 8 mg/dL (ref 6–20)
CALCIUM: 9 mg/dL (ref 8.9–10.3)
CHLORIDE: 100 mmol/L — AB (ref 101–111)
CO2: 25 mmol/L (ref 22–32)
CREATININE: 1.04 mg/dL (ref 0.61–1.24)
Glucose, Bld: 106 mg/dL — ABNORMAL HIGH (ref 65–99)
Potassium: 4.6 mmol/L (ref 3.5–5.1)
Sodium: 136 mmol/L (ref 135–145)
Total Protein: 6.9 g/dL (ref 6.5–8.1)

## 2017-09-13 LAB — URINALYSIS, ROUTINE W REFLEX MICROSCOPIC
BILIRUBIN URINE: NEGATIVE
Glucose, UA: NEGATIVE mg/dL
Hgb urine dipstick: NEGATIVE
KETONES UR: NEGATIVE mg/dL
LEUKOCYTES UA: NEGATIVE
Nitrite: NEGATIVE
Protein, ur: NEGATIVE mg/dL
SPECIFIC GRAVITY, URINE: 1.033 — AB (ref 1.005–1.030)
pH: 7 (ref 5.0–8.0)

## 2017-09-13 LAB — RAPID URINE DRUG SCREEN, HOSP PERFORMED
AMPHETAMINES: POSITIVE — AB
Barbiturates: NOT DETECTED
Benzodiazepines: POSITIVE — AB
Cocaine: NOT DETECTED
OPIATES: NOT DETECTED
Tetrahydrocannabinol: POSITIVE — AB

## 2017-09-13 LAB — CBC WITH DIFFERENTIAL/PLATELET
BASOS ABS: 0 10*3/uL (ref 0.0–0.1)
Basophils Relative: 0 %
EOS PCT: 4 %
Eosinophils Absolute: 0.3 10*3/uL (ref 0.0–0.7)
HCT: 43.1 % (ref 39.0–52.0)
HEMOGLOBIN: 14.5 g/dL (ref 13.0–17.0)
LYMPHS ABS: 2.5 10*3/uL (ref 0.7–4.0)
LYMPHS PCT: 32 %
MCH: 30.1 pg (ref 26.0–34.0)
MCHC: 33.6 g/dL (ref 30.0–36.0)
MCV: 89.4 fL (ref 78.0–100.0)
Monocytes Absolute: 0.6 10*3/uL (ref 0.1–1.0)
Monocytes Relative: 7 %
Neutro Abs: 4.4 10*3/uL (ref 1.7–7.7)
Neutrophils Relative %: 57 %
Platelets: 223 10*3/uL (ref 150–400)
RBC: 4.82 MIL/uL (ref 4.22–5.81)
RDW: 13.3 % (ref 11.5–15.5)
WBC: 7.8 10*3/uL (ref 4.0–10.5)

## 2017-09-13 LAB — I-STAT CHEM 8, ED
BUN: 8 mg/dL (ref 6–20)
CHLORIDE: 99 mmol/L — AB (ref 101–111)
Calcium, Ion: 1.09 mmol/L — ABNORMAL LOW (ref 1.15–1.40)
Creatinine, Ser: 1 mg/dL (ref 0.61–1.24)
GLUCOSE: 104 mg/dL — AB (ref 65–99)
HEMATOCRIT: 43 % (ref 39.0–52.0)
HEMOGLOBIN: 14.6 g/dL (ref 13.0–17.0)
Potassium: 4.6 mmol/L (ref 3.5–5.1)
Sodium: 139 mmol/L (ref 135–145)
TCO2: 31 mmol/L (ref 22–32)

## 2017-09-13 LAB — AMMONIA: AMMONIA: 41 umol/L — AB (ref 9–35)

## 2017-09-13 LAB — ETHANOL

## 2017-09-13 MED ORDER — NALOXONE HCL 2 MG/2ML IJ SOSY
2.0000 mg | PREFILLED_SYRINGE | Freq: Once | INTRAMUSCULAR | Status: AC
  Administered 2017-09-13: 2 mg via INTRAVENOUS

## 2017-09-13 MED ORDER — NALOXONE HCL 0.4 MG/ML IJ SOLN
0.4000 mg | Freq: Once | INTRAMUSCULAR | Status: AC
  Administered 2017-09-13: 0.4 mg via INTRAVENOUS

## 2017-09-13 MED ORDER — IOPAMIDOL (ISOVUE-300) INJECTION 61%
INTRAVENOUS | Status: AC
  Administered 2017-09-13: 100 mL
  Filled 2017-09-13: qty 100

## 2017-09-13 NOTE — ED Notes (Signed)
Patient understood discharge instructions. No acute distress noted.

## 2017-09-13 NOTE — Progress Notes (Signed)
   09/13/17 0200  Clinical Encounter Type  Visited With Patient;Family  Visit Type ED;Trauma  Referral From Nurse  Spiritual Encounters  Spiritual Needs Other (Comment) (Called patient's mom)   Chaplain called patient's mom, Darnelle CatalanMalinda, who [place] from [place]. Chaplain asked the ED staff to page him when and if she needs anything upon her arrival.
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
## 2017-09-13 NOTE — Discharge Instructions (Signed)
Do not take any medications that are not yours.  Follow-up with your primary doctor.  Return to the ED if you develop new or worsening symptoms.
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
## 2017-09-13 NOTE — ED Notes (Addendum)
Patient transported to CT 

## 2019-03-10 ENCOUNTER — Inpatient Hospital Stay (HOSPITAL_COMMUNITY): Payer: No Typology Code available for payment source

## 2019-03-10 ENCOUNTER — Encounter (HOSPITAL_COMMUNITY): Payer: Self-pay | Admitting: Emergency Medicine

## 2019-03-10 ENCOUNTER — Other Ambulatory Visit: Payer: Self-pay

## 2019-03-10 ENCOUNTER — Inpatient Hospital Stay (HOSPITAL_COMMUNITY)
Admission: EM | Admit: 2019-03-10 | Discharge: 2019-03-12 | DRG: 493 | Disposition: A | Discharge status: Other Acute Inpatient Hospital | Payer: No Typology Code available for payment source | Attending: General Surgery | Admitting: General Surgery

## 2019-03-10 ENCOUNTER — Emergency Department (HOSPITAL_COMMUNITY): Payer: No Typology Code available for payment source

## 2019-03-10 DIAGNOSIS — H1133 Conjunctival hemorrhage, bilateral: Secondary | ICD-10-CM | POA: Diagnosis present

## 2019-03-10 DIAGNOSIS — Z87891 Personal history of nicotine dependence: Secondary | ICD-10-CM | POA: Diagnosis not present

## 2019-03-10 DIAGNOSIS — S0240CA Maxillary fracture, right side, initial encounter for closed fracture: Secondary | ICD-10-CM | POA: Diagnosis present

## 2019-03-10 DIAGNOSIS — S82291A Other fracture of shaft of right tibia, initial encounter for closed fracture: Secondary | ICD-10-CM | POA: Diagnosis present

## 2019-03-10 DIAGNOSIS — S0240EA Zygomatic fracture, right side, initial encounter for closed fracture: Secondary | ICD-10-CM | POA: Diagnosis present

## 2019-03-10 DIAGNOSIS — S0292XA Unspecified fracture of facial bones, initial encounter for closed fracture: Secondary | ICD-10-CM | POA: Diagnosis present

## 2019-03-10 DIAGNOSIS — S62316A Displaced fracture of base of fifth metacarpal bone, right hand, initial encounter for closed fracture: Secondary | ICD-10-CM | POA: Diagnosis present

## 2019-03-10 DIAGNOSIS — S0232XA Fracture of orbital floor, left side, initial encounter for closed fracture: Secondary | ICD-10-CM | POA: Diagnosis present

## 2019-03-10 DIAGNOSIS — S62109A Fracture of unspecified carpal bone, unspecified wrist, initial encounter for closed fracture: Secondary | ICD-10-CM

## 2019-03-10 DIAGNOSIS — S52611A Displaced fracture of right ulna styloid process, initial encounter for closed fracture: Secondary | ICD-10-CM | POA: Diagnosis present

## 2019-03-10 DIAGNOSIS — S52571A Other intraarticular fracture of lower end of right radius, initial encounter for closed fracture: Secondary | ICD-10-CM | POA: Diagnosis present

## 2019-03-10 DIAGNOSIS — S0240DA Maxillary fracture, left side, initial encounter for closed fracture: Secondary | ICD-10-CM | POA: Diagnosis present

## 2019-03-10 DIAGNOSIS — S62101A Fracture of unspecified carpal bone, right wrist, initial encounter for closed fracture: Secondary | ICD-10-CM

## 2019-03-10 DIAGNOSIS — Y92410 Unspecified street and highway as the place of occurrence of the external cause: Secondary | ICD-10-CM

## 2019-03-10 DIAGNOSIS — S022XXA Fracture of nasal bones, initial encounter for closed fracture: Secondary | ICD-10-CM | POA: Diagnosis present

## 2019-03-10 DIAGNOSIS — S0231XA Fracture of orbital floor, right side, initial encounter for closed fracture: Secondary | ICD-10-CM | POA: Diagnosis present

## 2019-03-10 DIAGNOSIS — S0219XA Other fracture of base of skull, initial encounter for closed fracture: Secondary | ICD-10-CM | POA: Diagnosis present

## 2019-03-10 DIAGNOSIS — S82201A Unspecified fracture of shaft of right tibia, initial encounter for closed fracture: Secondary | ICD-10-CM

## 2019-03-10 DIAGNOSIS — Z23 Encounter for immunization: Secondary | ICD-10-CM | POA: Diagnosis not present

## 2019-03-10 DIAGNOSIS — T148XXA Other injury of unspecified body region, initial encounter: Secondary | ICD-10-CM

## 2019-03-10 DIAGNOSIS — S82401A Unspecified fracture of shaft of right fibula, initial encounter for closed fracture: Secondary | ICD-10-CM | POA: Diagnosis present

## 2019-03-10 DIAGNOSIS — Z20828 Contact with and (suspected) exposure to other viral communicable diseases: Secondary | ICD-10-CM | POA: Diagnosis present

## 2019-03-10 DIAGNOSIS — T1490XA Injury, unspecified, initial encounter: Secondary | ICD-10-CM

## 2019-03-10 LAB — I-STAT CHEM 8, ED
BUN: 15 mg/dL (ref 6–20)
Calcium, Ion: 1.18 mmol/L (ref 1.15–1.40)
Chloride: 104 mmol/L (ref 98–111)
Creatinine, Ser: 0.8 mg/dL (ref 0.61–1.24)
Glucose, Bld: 125 mg/dL — ABNORMAL HIGH (ref 70–99)
HCT: 37 % — ABNORMAL LOW (ref 39.0–52.0)
Hemoglobin: 12.6 g/dL — ABNORMAL LOW (ref 13.0–17.0)
Potassium: 3.6 mmol/L (ref 3.5–5.1)
Sodium: 142 mmol/L (ref 135–145)
TCO2: 25 mmol/L (ref 22–32)

## 2019-03-10 LAB — COMPREHENSIVE METABOLIC PANEL
ALT: 137 U/L — ABNORMAL HIGH (ref 0–44)
AST: 95 U/L — ABNORMAL HIGH (ref 15–41)
Albumin: 3.8 g/dL (ref 3.5–5.0)
Alkaline Phosphatase: 50 U/L (ref 38–126)
Anion gap: 9 (ref 5–15)
BUN: 14 mg/dL (ref 6–20)
CO2: 26 mmol/L (ref 22–32)
Calcium: 8.9 mg/dL (ref 8.9–10.3)
Chloride: 103 mmol/L (ref 98–111)
Creatinine, Ser: 0.93 mg/dL (ref 0.61–1.24)
GFR calc Af Amer: >60 mL/min (ref >60)
GFR calc non Af Amer: >60 mL/min (ref >60)
Glucose, Bld: 131 mg/dL — ABNORMAL HIGH (ref 70–99)
Potassium: 3.6 mmol/L (ref 3.5–5.1)
Sodium: 138 mmol/L (ref 135–145)
Total Bilirubin: 0.7 mg/dL (ref 0.3–1.2)
Total Protein: 6.8 g/dL (ref 6.5–8.1)

## 2019-03-10 LAB — CBC
HCT: 37.3 % — ABNORMAL LOW (ref 39.0–52.0)
Hemoglobin: 12.1 g/dL — ABNORMAL LOW (ref 13.0–17.0)
MCH: 29.4 pg (ref 26.0–34.0)
MCHC: 32.4 g/dL (ref 30.0–36.0)
MCV: 90.5 fL (ref 80.0–100.0)
Platelets: 213 10*3/uL (ref 150–400)
RBC: 4.12 MIL/uL — ABNORMAL LOW (ref 4.22–5.81)
RDW: 12.1 % (ref 11.5–15.5)
WBC: 9.4 10*3/uL (ref 4.0–10.5)
nRBC: 0 % (ref 0.0–0.2)

## 2019-03-10 LAB — BLOOD PRODUCT ORDER (VERBAL) VERIFICATION

## 2019-03-10 LAB — LACTIC ACID, PLASMA: Lactic Acid, Venous: 1.1 mmol/L (ref 0.5–1.9)

## 2019-03-10 LAB — SARS CORONAVIRUS 2 BY RT PCR (HOSPITAL ORDER, PERFORMED IN ~~LOC~~ HOSPITAL LAB): SARS Coronavirus 2: NEGATIVE

## 2019-03-10 LAB — APTT: aPTT: 24 seconds (ref 24–36)

## 2019-03-10 LAB — PROTIME-INR
INR: 1.1 (ref 0.8–1.2)
Prothrombin Time: 13.7 seconds (ref 11.4–15.2)

## 2019-03-10 LAB — CDS SEROLOGY

## 2019-03-10 LAB — ETHANOL: Alcohol, Ethyl (B): <10 mg/dL (ref <10)

## 2019-03-10 MED ORDER — FENTANYL CITRATE (PF) 100 MCG/2ML IJ SOLN
INTRAMUSCULAR | Status: AC
  Filled 2019-03-10: qty 2

## 2019-03-10 MED ORDER — HYDROMORPHONE HCL 1 MG/ML IJ SOLN
2.0000 mg | INTRAMUSCULAR | Status: DC | PRN
  Administered 2019-03-11 – 2019-03-12 (×9): 2 mg via INTRAVENOUS
  Filled 2019-03-10 (×9): qty 2

## 2019-03-10 MED ORDER — ENSURE PRE-SURGERY PO LIQD
296.0000 mL | Freq: Once | ORAL | Status: DC
  Filled 2019-03-10: qty 296

## 2019-03-10 MED ORDER — CEFAZOLIN SODIUM-DEXTROSE 2-4 GM/100ML-% IV SOLN
2.0000 g | INTRAVENOUS | Status: DC
  Filled 2019-03-10: qty 100

## 2019-03-10 MED ORDER — ONDANSETRON HCL 4 MG/2ML IJ SOLN: 4.0000 mg | Freq: Four times a day (QID) | INTRAMUSCULAR | Status: DC | PRN

## 2019-03-10 MED ORDER — TETANUS-DIPHTH-ACELL PERTUSSIS 5-2.5-18.5 LF-MCG/0.5 IM SUSP
0.5000 mL | Freq: Once | INTRAMUSCULAR | Status: AC
  Administered 2019-03-10: 14:00:00 0.5 mL via INTRAMUSCULAR
  Filled 2019-03-10: qty 0.5

## 2019-03-10 MED ORDER — LIDOCAINE HCL (PF) 1 % IJ SOLN
10.0000 mL | Freq: Once | INTRAMUSCULAR | Status: AC
  Administered 2019-03-10: 14:00:00 10 mL
  Filled 2019-03-10: qty 10

## 2019-03-10 MED ORDER — IOHEXOL 300 MG/ML  SOLN
100.0000 mL | Freq: Once | INTRAMUSCULAR | Status: AC | PRN
  Administered 2019-03-10: 100 mL via INTRAVENOUS

## 2019-03-10 MED ORDER — PANTOPRAZOLE SODIUM 40 MG IV SOLR
40.0000 mg | Freq: Every day | INTRAVENOUS | Status: DC
  Administered 2019-03-10 – 2019-03-12 (×2): 40 mg via INTRAVENOUS
  Filled 2019-03-10 (×2): qty 40

## 2019-03-10 MED ORDER — CHLORHEXIDINE GLUCONATE CLOTH 2 % EX PADS: 6.0000 | MEDICATED_PAD | Freq: Every day | CUTANEOUS | Status: DC

## 2019-03-10 MED ORDER — HYDROMORPHONE HCL 1 MG/ML IJ SOLN
1.0000 mg | INTRAMUSCULAR | Status: DC | PRN
  Administered 2019-03-10 – 2019-03-11 (×3): 1 mg via INTRAVENOUS
  Filled 2019-03-10 (×3): qty 1

## 2019-03-10 MED ORDER — ONDANSETRON 4 MG PO TBDP: 4.0000 mg | ORAL_TABLET | Freq: Four times a day (QID) | ORAL | Status: DC | PRN

## 2019-03-10 MED ORDER — HYDRALAZINE HCL 20 MG/ML IJ SOLN: 10.0000 mg | INTRAMUSCULAR | Status: DC | PRN

## 2019-03-10 MED ORDER — LIDOCAINE HCL 1 % IJ SOLN: 10.0000 mL | Freq: Once | INTRAMUSCULAR | Status: DC

## 2019-03-10 MED ORDER — FENTANYL CITRATE (PF) 100 MCG/2ML IJ SOLN
INTRAMUSCULAR | Status: AC | PRN
  Administered 2019-03-10 (×2): 100 ug via INTRAVENOUS

## 2019-03-10 MED ORDER — ONDANSETRON HCL 4 MG/2ML IJ SOLN
4.0000 mg | Freq: Once | INTRAMUSCULAR | Status: AC
  Administered 2019-03-10: 4 mg via INTRAVENOUS
  Filled 2019-03-10: qty 2

## 2019-03-10 MED ORDER — CHLORHEXIDINE GLUCONATE 4 % EX LIQD
60.0000 mL | Freq: Once | CUTANEOUS | Status: DC
  Filled 2019-03-10: qty 60

## 2019-03-10 MED ORDER — POVIDONE-IODINE 10 % EX SWAB: 2.0000 "application " | Freq: Once | CUTANEOUS | Status: DC

## 2019-03-10 MED ORDER — POTASSIUM CHLORIDE IN NACL 20-0.9 MEQ/L-% IV SOLN
INTRAVENOUS | Status: DC
  Administered 2019-03-10 – 2019-03-11 (×3): via INTRAVENOUS
  Filled 2019-03-10 (×3): qty 1000

## 2019-03-10 MED ORDER — PANTOPRAZOLE SODIUM 40 MG PO TBEC: 40.0000 mg | DELAYED_RELEASE_TABLET | Freq: Every day | ORAL | Status: DC

## 2019-03-10 MED ORDER — SODIUM CHLORIDE 0.9 % IV SOLN
INTRAVENOUS | Status: AC | PRN
  Administered 2019-03-10: 1000 mL via INTRAVENOUS
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
## 2019-03-10 NOTE — ED Notes (Signed)
Family updated as to patient's status.
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
## 2019-03-10 NOTE — ED Notes (Signed)
Demographics faxed to [facility]
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
## 2019-03-10 NOTE — Procedures (Signed)
Procedure: Right wrist closed reduction  Indication: Right wrist fx  Surgeon: Silvestre Gunner, PA-C  Assist: None  Anesthesia: 74ml 1% plain lidocaine as hematoma block  EBL: None  Complications: Failure of reduction  Findings: After risks/benefits explained patient desires to undergo procedure. The right wrist was sterilely prepped and lidocaine instilled into hematoma. Attempt was made to reduce wrist but was too painful and pt could not tolerate and made me stop. It looked clinically better but x-rays show little improvement. He is set for ORIF tomorrow.    Lisette Abu, PA-C Orthopedic Surgery [phone]
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
## 2019-03-10 NOTE — ED Notes (Signed)
Pt valuables locked with security envelope # [id number], by Mervin Hack, RN, pt also has 4 personal belonging bags we will send with patient.

## 2019-03-10 NOTE — ED Notes (Signed)
Pt to CT with RN

## 2019-03-10 NOTE — ED Notes (Signed)
Family at beside. Family given emotional support. 

## 2019-03-10 NOTE — Progress Notes (Signed)
Orthopedic Tech Progress Note Patient Details:  Timothy Gray 09-11-1985 [id number]  Ortho Devices Type of Ortho Device: Arm sling, Post (long leg) splint, Stirrup splint, Sugartong splint Ortho Device/Splint Location: rue sugartong applied post reduction with drs assistance. rle post long leg and stirrup applied by ortho dr. Manson Passey Device/Splint Interventions: Ordered, Application, Adjustment   Post Interventions Patient Tolerated: Well Instructions Provided: Care of device, Adjustment of device   Karolee Stamps 03/10/2019, 2:41 PM
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
## 2019-03-10 NOTE — ED Notes (Signed)
To xray with transporter.  

## 2019-03-11 ENCOUNTER — Encounter (HOSPITAL_COMMUNITY)
Admission: EM | Disposition: A | Discharge status: Other Acute Inpatient Hospital | Payer: Self-pay | Source: Home / Self Care

## 2019-03-11 ENCOUNTER — Inpatient Hospital Stay (HOSPITAL_COMMUNITY): Payer: No Typology Code available for payment source

## 2019-03-11 ENCOUNTER — Inpatient Hospital Stay (HOSPITAL_COMMUNITY): Payer: No Typology Code available for payment source | Admitting: Certified Registered Nurse Anesthetist

## 2019-03-11 HISTORY — PX: TIBIA IM NAIL INSERTION: SHX2516

## 2019-03-11 HISTORY — PX: OPEN REDUCTION INTERNAL FIXATION (ORIF) DISTAL RADIAL FRACTURE: SHX5989

## 2019-03-11 LAB — CBC
HCT: 32.5 % — ABNORMAL LOW (ref 39.0–52.0)
Hemoglobin: 10.8 g/dL — ABNORMAL LOW (ref 13.0–17.0)
MCH: 29.7 pg (ref 26.0–34.0)
MCHC: 33.2 g/dL (ref 30.0–36.0)
MCV: 89.3 fL (ref 80.0–100.0)
Platelets: 185 10*3/uL (ref 150–400)
RBC: 3.64 MIL/uL — ABNORMAL LOW (ref 4.22–5.81)
RDW: 12.4 % (ref 11.5–15.5)
WBC: 12.6 10*3/uL — ABNORMAL HIGH (ref 4.0–10.5)
nRBC: 0 % (ref 0.0–0.2)

## 2019-03-11 LAB — BASIC METABOLIC PANEL
Anion gap: 8 (ref 5–15)
BUN: 23 mg/dL — ABNORMAL HIGH (ref 6–20)
CO2: 24 mmol/L (ref 22–32)
Calcium: 8.1 mg/dL — ABNORMAL LOW (ref 8.9–10.3)
Chloride: 105 mmol/L (ref 98–111)
Creatinine, Ser: 0.71 mg/dL (ref 0.61–1.24)
GFR calc Af Amer: >60 mL/min (ref >60)
GFR calc non Af Amer: >60 mL/min (ref >60)
Glucose, Bld: 109 mg/dL — ABNORMAL HIGH (ref 70–99)
Potassium: 4 mmol/L (ref 3.5–5.1)
Sodium: 137 mmol/L (ref 135–145)

## 2019-03-11 LAB — HIV ANTIBODY (ROUTINE TESTING W REFLEX): HIV Screen 4th Generation wRfx: NONREACTIVE

## 2019-03-11 SURGERY — INSERTION, INTRAMEDULLARY ROD, TIBIA
Anesthesia: General | Site: Leg Lower | Laterality: Right

## 2019-03-11 MED ORDER — PROPOFOL 10 MG/ML IV BOLUS
INTRAVENOUS | Status: AC
  Filled 2019-03-11: qty 20

## 2019-03-11 MED ORDER — LIDOCAINE 2% (20 MG/ML) 5 ML SYRINGE
INTRAMUSCULAR | Status: DC | PRN
  Administered 2019-03-11: 100 mg via INTRAVENOUS

## 2019-03-11 MED ORDER — VANCOMYCIN HCL 1000 MG IV SOLR
INTRAVENOUS | Status: AC
  Filled 2019-03-11: qty 1000

## 2019-03-11 MED ORDER — BUPIVACAINE HCL (PF) 0.5 % IJ SOLN
INTRAMUSCULAR | Status: AC
  Filled 2019-03-11: qty 30

## 2019-03-11 MED ORDER — HYDROMORPHONE HCL 1 MG/ML IJ SOLN: 0.2500 mg | INTRAMUSCULAR | Status: DC | PRN

## 2019-03-11 MED ORDER — CHLORHEXIDINE GLUCONATE CLOTH 2 % EX PADS
6.0000 | MEDICATED_PAD | Freq: Every day | CUTANEOUS | Status: DC
  Administered 2019-03-11 – 2019-03-12 (×2): 6 via TOPICAL

## 2019-03-11 MED ORDER — PROMETHAZINE HCL 25 MG/ML IJ SOLN: 6.2500 mg | INTRAMUSCULAR | Status: DC | PRN

## 2019-03-11 MED ORDER — FENTANYL CITRATE (PF) 250 MCG/5ML IJ SOLN
INTRAMUSCULAR | Status: DC | PRN
  Administered 2019-03-11: 25 ug via INTRAVENOUS
  Administered 2019-03-11: 150 ug via INTRAVENOUS
  Administered 2019-03-11: 50 ug via INTRAVENOUS
  Administered 2019-03-11: 25 ug via INTRAVENOUS
  Administered 2019-03-11: 50 ug via INTRAVENOUS
  Administered 2019-03-11 (×2): 25 ug via INTRAVENOUS
  Administered 2019-03-11: 50 ug via INTRAVENOUS

## 2019-03-11 MED ORDER — ROCURONIUM BROMIDE 10 MG/ML (PF) SYRINGE
PREFILLED_SYRINGE | INTRAVENOUS | Status: DC | PRN
  Administered 2019-03-11: 10 mg via INTRAVENOUS
  Administered 2019-03-11: 20 mg via INTRAVENOUS
  Administered 2019-03-11: 50 mg via INTRAVENOUS

## 2019-03-11 MED ORDER — PHENYLEPHRINE 40 MCG/ML (10ML) SYRINGE FOR IV PUSH (FOR BLOOD PRESSURE SUPPORT)
PREFILLED_SYRINGE | INTRAVENOUS | Status: DC | PRN
  Administered 2019-03-11 (×2): 120 ug via INTRAVENOUS

## 2019-03-11 MED ORDER — FENTANYL CITRATE (PF) 250 MCG/5ML IJ SOLN
INTRAMUSCULAR | Status: AC
  Filled 2019-03-11: qty 5

## 2019-03-11 MED ORDER — ROCURONIUM BROMIDE 10 MG/ML (PF) SYRINGE
PREFILLED_SYRINGE | INTRAVENOUS | Status: AC
  Filled 2019-03-11: qty 10

## 2019-03-11 MED ORDER — ONDANSETRON HCL 4 MG/2ML IJ SOLN
INTRAMUSCULAR | Status: DC | PRN
  Administered 2019-03-11: 4 mg via INTRAVENOUS

## 2019-03-11 MED ORDER — SUGAMMADEX SODIUM 200 MG/2ML IV SOLN
INTRAVENOUS | Status: DC | PRN
  Administered 2019-03-11: 150 mg via INTRAVENOUS

## 2019-03-11 MED ORDER — BUPIVACAINE-EPINEPHRINE (PF) 0.5% -1:200000 IJ SOLN
INTRAMUSCULAR | Status: AC
  Filled 2019-03-11: qty 30

## 2019-03-11 MED ORDER — CEFAZOLIN SODIUM-DEXTROSE 2-3 GM-%(50ML) IV SOLR
INTRAVENOUS | Status: DC | PRN
  Administered 2019-03-11: 2 g via INTRAVENOUS

## 2019-03-11 MED ORDER — SODIUM CHLORIDE 0.9 % IV SOLN
INTRAVENOUS | Status: DC | PRN
  Administered 2019-03-11: 11:00:00 25 ug/min via INTRAVENOUS

## 2019-03-11 MED ORDER — DEXAMETHASONE SODIUM PHOSPHATE 10 MG/ML IJ SOLN
INTRAMUSCULAR | Status: AC
  Filled 2019-03-11: qty 1

## 2019-03-11 MED ORDER — MIDAZOLAM HCL 2 MG/2ML IJ SOLN
INTRAMUSCULAR | Status: AC
  Filled 2019-03-11: qty 2

## 2019-03-11 MED ORDER — ONDANSETRON HCL 4 MG/2ML IJ SOLN
INTRAMUSCULAR | Status: AC
  Filled 2019-03-11: qty 2

## 2019-03-11 MED ORDER — LACTATED RINGERS IV SOLN
INTRAVENOUS | Status: DC | PRN
  Administered 2019-03-11 (×2): via INTRAVENOUS

## 2019-03-11 MED ORDER — CEFAZOLIN SODIUM-DEXTROSE 2-4 GM/100ML-% IV SOLN
2.0000 g | Freq: Three times a day (TID) | INTRAVENOUS | Status: DC
  Administered 2019-03-11 – 2019-03-12 (×2): 2 g via INTRAVENOUS
  Filled 2019-03-11 (×3): qty 100

## 2019-03-11 MED ORDER — 0.9 % SODIUM CHLORIDE (POUR BTL) OPTIME
TOPICAL | Status: DC | PRN
  Administered 2019-03-11: 1000 mL

## 2019-03-11 MED ORDER — VANCOMYCIN HCL 1000 MG IV SOLR
INTRAVENOUS | Status: DC | PRN
  Administered 2019-03-11: 1000 mg

## 2019-03-11 MED ORDER — SUCCINYLCHOLINE CHLORIDE 200 MG/10ML IV SOSY
PREFILLED_SYRINGE | INTRAVENOUS | Status: DC | PRN
  Administered 2019-03-11: 140 mg via INTRAVENOUS

## 2019-03-11 MED ORDER — DEXAMETHASONE SODIUM PHOSPHATE 10 MG/ML IJ SOLN
INTRAMUSCULAR | Status: DC | PRN
  Administered 2019-03-11: 10 mg via INTRAVENOUS

## 2019-03-11 MED ORDER — PROPOFOL 10 MG/ML IV BOLUS
INTRAVENOUS | Status: DC | PRN
  Administered 2019-03-11: 140 mg via INTRAVENOUS

## 2019-03-11 MED ORDER — LIDOCAINE 2% (20 MG/ML) 5 ML SYRINGE
INTRAMUSCULAR | Status: AC
  Filled 2019-03-11: qty 5

## 2019-03-11 MED ORDER — BUPIVACAINE HCL (PF) 0.5 % IJ SOLN
INTRAMUSCULAR | Status: DC | PRN
  Administered 2019-03-11: 30 mL

## 2019-03-11 MED ORDER — MEPERIDINE HCL 25 MG/ML IJ SOLN: 6.2500 mg | INTRAMUSCULAR | Status: DC | PRN

## 2019-03-11 SURGICAL SUPPLY — 74 items
BIT DRILL 100X2XQC STRL (BIT) IMPLANT
BIT DRILL CALIBRATED 1.8MM (BIT) IMPLANT
BIT DRILL CALIBRATED 4.2 (BIT) IMPLANT
BIT DRILL QC 2.0X100 (BIT) ×1
BIT DRILL SHORT 4.2 (BIT) IMPLANT
BIT DRL 100X2XQC STRL (BIT) ×2
BLADE SURG 10 STRL SS (BLADE) ×6 IMPLANT
BNDG COHESIVE 4X5 TAN STRL (GAUZE/BANDAGES/DRESSINGS) ×3 IMPLANT
BNDG ELASTIC 4X5.8 VLCR STR LF (GAUZE/BANDAGES/DRESSINGS) ×3 IMPLANT
BNDG ELASTIC 6X10 VLCR STRL LF (GAUZE/BANDAGES/DRESSINGS) ×1 IMPLANT
BNDG ELASTIC 6X5.8 VLCR STR LF (GAUZE/BANDAGES/DRESSINGS) ×3 IMPLANT
BNDG GAUZE ELAST 4 BULKY (GAUZE/BANDAGES/DRESSINGS) ×3 IMPLANT
BRUSH SCRUB EZ PLAIN DRY (MISCELLANEOUS) ×6 IMPLANT
CHLORAPREP W/TINT 26 (MISCELLANEOUS) ×3 IMPLANT
COVER SURGICAL LIGHT HANDLE (MISCELLANEOUS) ×6 IMPLANT
COVER WAND RF STERILE (DRAPES) ×3 IMPLANT
DRAPE C-ARM 42X72 X-RAY (DRAPES) ×3 IMPLANT
DRAPE C-ARMOR (DRAPES) ×3 IMPLANT
DRAPE HALF SHEET 40X57 (DRAPES) ×6 IMPLANT
DRAPE IMP U-DRAPE 54X76 (DRAPES) ×6 IMPLANT
DRAPE INCISE IOBAN 66X45 STRL (DRAPES) IMPLANT
DRAPE ORTHO SPLIT 77X108 STRL (DRAPES) ×2
DRAPE SURG ORHT 6 SPLT 77X108 (DRAPES) ×4 IMPLANT
DRAPE U-SHAPE 47X51 STRL (DRAPES) ×3 IMPLANT
DRILL BIT CALIBRATED 4.2 (BIT) ×3
DRILL BIT SHORT 4.2 (BIT) ×2
DRILL CALIBRATED 1.8MM (BIT) ×3
DRSG ADAPTIC 3X8 NADH LF (GAUZE/BANDAGES/DRESSINGS) ×3 IMPLANT
ELECT REM PT RETURN 9FT ADLT (ELECTROSURGICAL) ×3
ELECTRODE REM PT RTRN 9FT ADLT (ELECTROSURGICAL) ×2 IMPLANT
GAUZE SPONGE 4X4 12PLY STRL (GAUZE/BANDAGES/DRESSINGS) ×4 IMPLANT
GLOVE BIO SURGEON STRL SZ 6.5 (GLOVE) ×9 IMPLANT
GLOVE BIO SURGEON STRL SZ7.5 (GLOVE) ×12 IMPLANT
GLOVE BIOGEL PI IND STRL 6.5 (GLOVE) ×2 IMPLANT
GLOVE BIOGEL PI IND STRL 7.5 (GLOVE) ×2 IMPLANT
GLOVE BIOGEL PI INDICATOR 6.5 (GLOVE) ×1
GLOVE BIOGEL PI INDICATOR 7.5 (GLOVE) ×1
GOWN STRL REUS W/ TWL LRG LVL3 (GOWN DISPOSABLE) ×4 IMPLANT
GOWN STRL REUS W/TWL LRG LVL3 (GOWN DISPOSABLE) ×2
GUIDEWIRE 3.2X400 (WIRE) ×1 IMPLANT
KIT BASIN OR (CUSTOM PROCEDURE TRAY) ×3 IMPLANT
KIT TURNOVER KIT B (KITS) ×3 IMPLANT
NAIL TIBIAL W/PROX BEND 10X345 (Nail) ×1 IMPLANT
PACK TOTAL JOINT (CUSTOM PROCEDURE TRAY) ×3 IMPLANT
PAD ARMBOARD 7.5X6 YLW CONV (MISCELLANEOUS) ×6 IMPLANT
PAD CAST 4YDX4 CTTN HI CHSV (CAST SUPPLIES) IMPLANT
PADDING CAST COTTON 4X4 STRL (CAST SUPPLIES) ×1
PADDING CAST COTTON 6X4 STRL (CAST SUPPLIES) ×1 IMPLANT
PLATE RT VA 2COL DIST RAD 6HX3 (Plate) ×1 IMPLANT
REAMER ROD DEEP FLUTE 2.5X950 (INSTRUMENTS) ×1 IMPLANT
SCREW CORTEX 2.4X16MM (Screw) ×1 IMPLANT
SCREW CORTEX 2.4X18 (Screw) ×1 IMPLANT
SCREW CORTEX 2.7 SLF-TPNG 16MM (Screw) ×1 IMPLANT
SCREW LOCK STAR 5X32 (Screw) ×1 IMPLANT
SCREW LOCK STAR 5X38 (Screw) ×2 IMPLANT
SCREW LOCK STAR 5X68 (Screw) ×1 IMPLANT
SCREW LOCK T8 20X2.4XSTVA (Screw) IMPLANT
SCREW LOCKING 2.4X20 (Screw) ×2 IMPLANT
SCREW LOCKING 2.4X22 (Screw) ×1 IMPLANT
SCREW SELF TAP 14MM (Screw) ×2 IMPLANT
SCREW VA LOCKING 2.4X16 (Screw) ×1 IMPLANT
SPLINT PLASTER CAST XFAST 5X30 (CAST SUPPLIES) IMPLANT
SPLINT PLASTER XFAST SET 5X30 (CAST SUPPLIES) ×1
STAPLER VISISTAT 35W (STAPLE) ×3 IMPLANT
STRIP CLOSURE SKIN 1/2X4 (GAUZE/BANDAGES/DRESSINGS) ×2 IMPLANT
SUCTION FRAZIER TIP 10 FR DISP (SUCTIONS) ×1 IMPLANT
SUT MNCRL AB 3-0 PS2 18 (SUTURE) ×4 IMPLANT
SUT VIC AB 0 CT1 27 (SUTURE) ×3
SUT VIC AB 0 CT1 27XBRD ANBCTR (SUTURE) IMPLANT
SUT VIC AB 2-0 CT1 27 (SUTURE) ×1
SUT VIC AB 2-0 CT1 TAPERPNT 27 (SUTURE) IMPLANT
TOWEL GREEN STERILE (TOWEL DISPOSABLE) ×6 IMPLANT
TOWEL GREEN STERILE FF (TOWEL DISPOSABLE) ×3 IMPLANT
YANKAUER SUCT BULB TIP NO VENT (SUCTIONS) ×1 IMPLANT

## 2019-03-11 NOTE — Anesthesia Preprocedure Evaluation (Addendum)
Anesthesia Evaluation  Patient identified by MRN, date of birth, ID band Patient confused    Reviewed: Allergy & Precautions, NPO status , Patient's Chart, lab work & pertinent test results  Airway Mallampati: III   Neck ROM: Limited    Dental  (+) Dental Advisory Given   Pulmonary Current Smoker,    Pulmonary exam normal breath sounds clear to auscultation       Cardiovascular negative cardio ROS Normal cardiovascular exam Rhythm:Regular Rate:Normal     Neuro/Psych negative neurological ROS  negative psych ROS   GI/Hepatic negative GI ROS, Neg liver ROS,   Endo/Other  negative endocrine ROS  Renal/GU negative Renal ROS     Musculoskeletal negative musculoskeletal ROS (+)   Abdominal   Peds  Hematology negative hematology ROS (+)   Anesthesia Other Findings   Reproductive/Obstetrics                            Anesthesia Physical Anesthesia Plan  ASA: III  Anesthesia Plan: General   Post-op Pain Management: GA combined w/ Regional for post-op pain   Induction: Intravenous  PONV Risk Score and Plan: 2 and Ondansetron and Dexamethasone  Airway Management Planned: Oral ETT and Video Laryngoscope Planned  Additional Equipment: None  Intra-op Plan:   Post-operative Plan: Extubation in OR  Informed Consent: I have reviewed the patients History and Physical, chart, labs and discussed the procedure including the risks, benefits and alternatives for the proposed anesthesia with the patient or authorized representative who has indicated his/her understanding and acceptance.     Dental advisory given  Plan Discussed with: CRNA  Anesthesia Plan Comments:        Anesthesia Quick Evaluation

## 2019-03-11 NOTE — Discharge Instructions (Signed)
Orthopaedic Trauma Service Discharge Instructions   General Discharge Instructions  WEIGHT BEARING STATUS: Weightbearing as tolerated through right leg. Non-weightbearing through right wrist, okay to be weightbearing through right elbow  RANGE OF MOTION/ACTIVITY: Okay to start knee range of motion. Start wiggling fingers to avoid stiffness. Okay for unrestricted elbow flexion and extension  Wound Care: Okay to remove dressing on leg on Monday (03/13/19). Can leave incisions open to air. Do not remove steri-strips, they will fall of on their own. Do not remove splint from right arm. Keep in clean and dry.  DVT/PE prophylaxis: Lovenox  Diet: as you were eating previously.  Can use over the counter stool softeners and bowel preparations, such as Miralax, to help with bowel movements.  Narcotics can be constipating.  Be sure to drink plenty of fluids  PAIN MEDICATION USE AND EXPECTATIONS  You have likely been given narcotic medications to help control your pain.  After a traumatic event that results in an fracture (broken bone) with or without surgery, it is ok to use narcotic pain medications to help control one's pain.  We understand that everyone responds to pain differently and each individual patient will be evaluated on a regular basis for the continued need for narcotic medications. Ideally, narcotic medication use should last no more than 6-8 weeks (coinciding with fracture healing).   As a patient it is your responsibility as well to monitor narcotic medication use and report the amount and frequency you use these medications when you come to your office visit.   We would also advise that if you are using narcotic medications, you should take a dose prior to therapy to maximize you participation.  IF YOU ARE ON NARCOTIC MEDICATIONS IT IS NOT PERMISSIBLE TO OPERATE A MOTOR VEHICLE (MOTORCYCLE/CAR/TRUCK/MOPED) OR HEAVY MACHINERY DO NOT MIX NARCOTICS WITH OTHER CNS (CENTRAL NERVOUS SYSTEM)  DEPRESSANTS SUCH AS ALCOHOL   STOP SMOKING OR USING NICOTINE PRODUCTS!!!!  As discussed nicotine severely impairs your body's ability to heal surgical and traumatic wounds but also impairs bone healing.  Wounds and bone heal by forming microscopic blood vessels (angiogenesis) and nicotine is a vasoconstrictor (essentially, shrinks blood vessels).  Therefore, if vasoconstriction occurs to these microscopic blood vessels they essentially disappear and are unable to deliver necessary nutrients to the healing tissue.  This is one modifiable factor that you can do to dramatically increase your chances of healing your injury.    (This means no smoking, no nicotine gum, patches, etc)  DO NOT USE NONSTEROIDAL ANTI-INFLAMMATORY DRUGS (NSAID'S)  Using products such as Advil (ibuprofen), Aleve (naproxen), Motrin (ibuprofen) for additional pain control during fracture healing can delay and/or prevent the healing response.  If you would like to take over the counter (OTC) medication, Tylenol (acetaminophen) is ok.  However, some narcotic medications that are given for pain control contain acetaminophen as well. Therefore, you should not exceed more than 4000 mg of tylenol in a day if you do not have liver disease.  Also note that there are may OTC medicines, such as cold medicines and allergy medicines that my contain tylenol as well.  If you have any questions about medications and/or interactions please ask your doctor/PA or your pharmacist.      ICE AND ELEVATE INJURED/OPERATIVE EXTREMITY  Using ice and elevating the injured extremity above your heart can help with swelling and pain control.  Icing in a pulsatile fashion, such as 20 minutes on and 20 minutes off, can be followed.    Do  not place ice directly on skin. Make sure there is a barrier between to skin and the ice pack.    Using frozen items such as frozen peas works well as the conform nicely to the are that needs to be iced.  USE AN ACE WRAP OR TED  HOSE FOR SWELLING CONTROL  In addition to icing and elevation, Ace wraps or TED hose are used to help limit and resolve swelling.  It is recommended to use Ace wraps or TED hose until you are informed to stop.    When using Ace Wraps start the wrapping distally (farthest away from the body) and wrap proximally (closer to the body)   Example: If you had surgery on your leg or thing and you do not have a splint on, start the ace wrap at the toes and work your way up to the thigh        If you had surgery on your upper extremity and do not have a splint on, start the ace wrap at your fingers and work your way up to the upper arm  IF YOU ARE IN A SPLINT OR CAST DO NOT [place]   If your splint gets wet for any reason please contact the office immediately. You may shower in your splint or cast as long as you keep it dry.  This can be done by wrapping in a cast cover or garbage back (or similar)  Do Not stick any thing down your splint or cast such as pencils, money, or hangers to try and scratch yourself with.  If you feel itchy take benadryl as prescribed on the bottle for itching   CALL THE OFFICE WITH ANY QUESTIONS OR CONCERNS: [phone]   VISIT OUR WEBSITE FOR ADDITIONAL INFORMATION: [email or web address]     Discharge Wound Care Instructions  Do NOT apply any ointments, solutions or lotions to pin sites or surgical wounds.  These prevent needed drainage and even though solutions like hydrogen peroxide kill bacteria, they also damage cells lining the pin sites that help fight infection.  Applying lotions or ointments can keep the wounds moist and can cause them to breakdown and open up as well. This can increase the risk for infection. When in doubt call the office.  Surgical incisions should be dressed daily.  If any drainage is noted, use one layer of adaptic, then gauze, Kerlix, and an ace wrap.  Once the incision is completely dry and without drainage, it may be left open  to air out.  Showering may begin 36-48 hours later.  Cleaning gently with soap and water.  Traumatic wounds should be dressed daily as well.    One layer of adaptic, gauze, Kerlix, then ace wrap.  The adaptic can be discontinued once the draining has ceased    If you have a wet to dry dressing: wet the gauze with saline the squeeze as much saline out so the gauze is moist (not soaking wet), place moistened gauze over wound, then place a dry gauze over the moist one, followed by Kerlix wrap, then ace wrap.

## 2019-03-11 NOTE — Transfer of Care (Signed)
Immediate Anesthesia Transfer of Care Note  Patient: Timothy Gray  Procedure(s) Performed: INTRAMEDULLARY (IM) NAIL TIBIAL (Right Leg Lower) OPEN REDUCTION INTERNAL FIXATION (ORIF) DISTAL RADIAL FRACTURE (Right )  Patient Location: PACU  Anesthesia Type:General  Level of Consciousness: drowsy, patient cooperative and responds to stimulation  Airway & Oxygen Therapy: Patient Spontanous Breathing and Patient connected to face mask oxygen  Post-op Assessment: Report given to RN and Post -op Vital signs reviewed and stable  Post vital signs: Reviewed and stable  Last Vitals:  Vitals Value Taken Time  BP 143/83 03/11/19 1327  Temp    Pulse 66 03/11/19 1330  Resp 13 03/11/19 1330  SpO2 100 % 03/11/19 1330  Vitals shown include unvalidated device data.  Last Pain:  Vitals:   03/11/19 0800  TempSrc: Axillary  PainSc: 0-No pain         Complications: No apparent anesthesia complications
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
## 2019-03-11 NOTE — Progress Notes (Signed)
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 orbital floor fractures with no signs of entrapment R zygomatic arch fx R and L bases of pterygoid fxs Nasal septal fxs Ethmoid sinus fxs - ENT consult pending - possible transfer to [facility] after Ortho surgery - per Dr. Doran Heater  FEN: NPO VTE: SCD's, lovenox ID: none currently Foley: none Follow up: TBD  Plan: admit to trauma service, OR tomorrow with orthopedics, ENT consult pending  LOS: 1 day    Wynona Luna 03/11/2019
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
## 2019-03-12 DIAGNOSIS — R768 Other specified abnormal immunological findings in serum: Secondary | ICD-10-CM | POA: Insufficient documentation

## 2019-03-12 LAB — CBC
HCT: 28.6 % — ABNORMAL LOW (ref 39.0–52.0)
Hemoglobin: 9.7 g/dL — ABNORMAL LOW (ref 13.0–17.0)
MCH: 29.8 pg (ref 26.0–34.0)
MCHC: 33.9 g/dL (ref 30.0–36.0)
MCV: 88 fL (ref 80.0–100.0)
Platelets: 167 10*3/uL (ref 150–400)
RBC: 3.25 MIL/uL — ABNORMAL LOW (ref 4.22–5.81)
RDW: 12.1 % (ref 11.5–15.5)
WBC: 9.5 10*3/uL (ref 4.0–10.5)
nRBC: 0 % (ref 0.0–0.2)

## 2019-03-12 LAB — URINALYSIS, ROUTINE W REFLEX MICROSCOPIC
Bilirubin Urine: NEGATIVE
Glucose, UA: NEGATIVE mg/dL
Hgb urine dipstick: NEGATIVE
Ketones, ur: NEGATIVE mg/dL
Leukocytes,Ua: NEGATIVE
Nitrite: NEGATIVE
Protein, ur: NEGATIVE mg/dL
Specific Gravity, Urine: 1.009 (ref 1.005–1.030)
pH: 7 (ref 5.0–8.0)

## 2019-03-12 MED ORDER — ENOXAPARIN SODIUM 40 MG/0.4ML ~~LOC~~ SOLN
40.0000 mg | SUBCUTANEOUS | Status: DC
  Administered 2019-03-12: 40 mg via SUBCUTANEOUS
  Filled 2019-03-12: qty 0.4

## 2019-03-12 NOTE — Progress Notes (Signed)
Patient departed unit at 11:58 via [facility]. All VSS, patient reported pain of 6 out of 10. Per his PRN pain med order, he wasn't able receive pain meds until 1220. [facility] stated they would treat pain en route. Patient with no complaints at time of discharge. Ice packs to RLE. Report called to Adele Dan, RN at the [facility] SICU. [name] (patient's mother) notified via phone of patients's departure.

## 2019-03-12 NOTE — Progress Notes (Signed)
OT /PT nOTe  Pt with pending transfer to [facility] at this time with pending [facility] called. OT / PT will defer evaluation to next setting as appropriate.   Jeri Modena, OTR/L  Acute Rehabilitation Services Pager: [phone] Office: [phone] .

## 2019-03-12 NOTE — Progress Notes (Signed)
Pt awake and alert Nad,  Oxy/vent well Extensive facial swelling Cta Reg  Soft, nt, nd No edema Ox3  Leighton Ruff. Redmond Pulling, MD, FACS General, Bariatric, & Minimally Invasive Surgery [facility], [place]
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
## 2019-03-13 ENCOUNTER — Encounter (HOSPITAL_COMMUNITY): Payer: Self-pay | Admitting: Student

## 2019-03-13 ENCOUNTER — Encounter: Payer: Self-pay | Admitting: Student

## 2019-03-13 ENCOUNTER — Encounter (HOSPITAL_COMMUNITY): Payer: Self-pay | Admitting: Orthopedic Surgery

## 2019-03-13 DIAGNOSIS — S82201A Unspecified fracture of shaft of right tibia, initial encounter for closed fracture: Secondary | ICD-10-CM | POA: Insufficient documentation

## 2019-03-13 DIAGNOSIS — S52501A Unspecified fracture of the lower end of right radius, initial encounter for closed fracture: Secondary | ICD-10-CM | POA: Insufficient documentation

## 2019-03-13 DIAGNOSIS — S62316A Displaced fracture of base of fifth metacarpal bone, right hand, initial encounter for closed fracture: Secondary | ICD-10-CM | POA: Insufficient documentation

## 2019-03-13 NOTE — Anesthesia Postprocedure Evaluation (Signed)
Anesthesia Post Note  Patient: Timothy Gray  Procedure(s) Performed: INTRAMEDULLARY (IM) NAIL TIBIAL (Right Leg Lower) OPEN REDUCTION INTERNAL FIXATION (ORIF) DISTAL RADIAL FRACTURE (Right )     Patient location during evaluation: PACU Anesthesia Type: General Level of consciousness: sedated and patient cooperative Pain management: pain level controlled Vital Signs Assessment: post-procedure vital signs reviewed and stable Respiratory status: spontaneous breathing Cardiovascular status: stable Anesthetic complications: no    Last Vitals:  Vitals:   03/12/19 1100 03/12/19 1149  BP:  (!) 120/59  Pulse: 73 (!) 112  Resp: 11 14  Temp:  37 C  SpO2: 100% 99%    Last Pain:  Vitals:   03/12/19 1149  TempSrc: Oral  PainSc: [place]

## 2019-03-13 NOTE — Progress Notes (Signed)
Received a call from [facility] admissions stating the patient was accepted and had a bed assignment on SICU.I was asked if we would need transport and I stated yes if possible because [facility]link was backed up.  I was transferred to SICU nurse to give report. Admissions attempted to conference in transport for report also, but they were unavailable. Patients mother at bedside. She was informed of transfer and given contact info for SICU at [facility].

## 2019-03-14 LAB — BPAM RBC
Blood Product Expiration Date: 202009122359
Blood Product Expiration Date: 202009122359
Blood Product Expiration Date: 202009282359
Blood Product Expiration Date: 202009282359
ISSUE DATE / TIME: 202008281714
ISSUE DATE / TIME: 202008290436
Unit Type and Rh: 5100
Unit Type and Rh: 5100
Unit Type and Rh: 6200
Unit Type and Rh: 6200

## 2019-03-14 LAB — TYPE AND SCREEN
ABO/RH(D): A POS
Antibody Screen: POSITIVE
Donor AG Type: NEGATIVE
Donor AG Type: NEGATIVE
PT AG Type: NEGATIVE
Unit division: 0
Unit division: 0
Unit division: 0
Unit division: 0
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
## 2021-02-06 ENCOUNTER — Emergency Department (HOSPITAL_COMMUNITY): Payer: Self-pay | Admitting: Anesthesiology

## 2021-02-06 ENCOUNTER — Emergency Department (HOSPITAL_COMMUNITY): Payer: Self-pay

## 2021-02-06 ENCOUNTER — Observation Stay (HOSPITAL_COMMUNITY)
Admission: EM | Admit: 2021-02-06 | Discharge: 2021-02-07 | Disposition: A | Discharge status: Home / Self Care | Payer: Self-pay | Attending: Orthopedic Surgery | Admitting: Orthopedic Surgery

## 2021-02-06 ENCOUNTER — Encounter (HOSPITAL_COMMUNITY): Payer: Self-pay | Admitting: Emergency Medicine

## 2021-02-06 ENCOUNTER — Encounter (HOSPITAL_COMMUNITY)
Admission: EM | Disposition: A | Discharge status: Home / Self Care | Payer: Self-pay | Source: Home / Self Care | Attending: Emergency Medicine

## 2021-02-06 DIAGNOSIS — F17200 Nicotine dependence, unspecified, uncomplicated: Secondary | ICD-10-CM | POA: Insufficient documentation

## 2021-02-06 DIAGNOSIS — Y990 Civilian activity done for income or pay: Secondary | ICD-10-CM | POA: Insufficient documentation

## 2021-02-06 DIAGNOSIS — Z23 Encounter for immunization: Secondary | ICD-10-CM | POA: Insufficient documentation

## 2021-02-06 DIAGNOSIS — S5402XA Injury of ulnar nerve at forearm level, left arm, initial encounter: Principal | ICD-10-CM | POA: Insufficient documentation

## 2021-02-06 DIAGNOSIS — W312XXA Contact with powered woodworking and forming machines, initial encounter: Secondary | ICD-10-CM | POA: Insufficient documentation

## 2021-02-06 DIAGNOSIS — Y9389 Activity, other specified: Secondary | ICD-10-CM | POA: Insufficient documentation

## 2021-02-06 DIAGNOSIS — W293XXA Contact with powered garden and outdoor hand tools and machinery, initial encounter: Secondary | ICD-10-CM | POA: Diagnosis present

## 2021-02-06 DIAGNOSIS — S41112A Laceration without foreign body of left upper arm, initial encounter: Secondary | ICD-10-CM | POA: Insufficient documentation

## 2021-02-06 DIAGNOSIS — S56902A Unspecified injury of unspecified muscles, fascia and tendons at forearm level, left arm, initial encounter: Secondary | ICD-10-CM | POA: Insufficient documentation

## 2021-02-06 DIAGNOSIS — Z20822 Contact with and (suspected) exposure to covid-19: Secondary | ICD-10-CM | POA: Insufficient documentation

## 2021-02-06 HISTORY — PX: I & D EXTREMITY: SHX5045

## 2021-02-06 LAB — CBC WITH DIFFERENTIAL/PLATELET
Abs Immature Granulocytes: 0.03 10*3/uL (ref 0.00–0.07)
Basophils Absolute: 0 10*3/uL (ref 0.0–0.1)
Basophils Relative: 1 %
Eosinophils Absolute: 0.1 10*3/uL (ref 0.0–0.5)
Eosinophils Relative: 2 %
HCT: 38.8 % — ABNORMAL LOW (ref 39.0–52.0)
Hemoglobin: 12.8 g/dL — ABNORMAL LOW (ref 13.0–17.0)
Immature Granulocytes: 0 %
Lymphocytes Relative: 33 %
Lymphs Abs: 2.8 10*3/uL (ref 0.7–4.0)
MCH: 30 pg (ref 26.0–34.0)
MCHC: 33 g/dL (ref 30.0–36.0)
MCV: 91.1 fL (ref 80.0–100.0)
Monocytes Absolute: 0.5 10*3/uL (ref 0.1–1.0)
Monocytes Relative: 6 %
Neutro Abs: 5.1 10*3/uL (ref 1.7–7.7)
Neutrophils Relative %: 58 %
Platelets: 122 10*3/uL — ABNORMAL LOW (ref 150–400)
RBC: 4.26 MIL/uL (ref 4.22–5.81)
RDW: 12.5 % (ref 11.5–15.5)
WBC: 8.6 10*3/uL (ref 4.0–10.5)
nRBC: 0 % (ref 0.0–0.2)

## 2021-02-06 LAB — RESP PANEL BY RT-PCR (FLU A&B, COVID) ARPGX2
Influenza A by PCR: NEGATIVE
Influenza B by PCR: NEGATIVE
SARS Coronavirus 2 by RT PCR: NEGATIVE

## 2021-02-06 LAB — BASIC METABOLIC PANEL
Anion gap: 12 (ref 5–15)
BUN: 13 mg/dL (ref 6–20)
CO2: 25 mmol/L (ref 22–32)
Calcium: 9.5 mg/dL (ref 8.9–10.3)
Chloride: 100 mmol/L (ref 98–111)
Creatinine, Ser: 1.26 mg/dL — ABNORMAL HIGH (ref 0.61–1.24)
GFR, Estimated: >60 mL/min (ref >60)
Glucose, Bld: 83 mg/dL (ref 70–99)
Potassium: 3.1 mmol/L — ABNORMAL LOW (ref 3.5–5.1)
Sodium: 137 mmol/L (ref 135–145)

## 2021-02-06 LAB — ABO/RH: ABO/RH(D): A POS

## 2021-02-06 SURGERY — IRRIGATION AND DEBRIDEMENT EXTREMITY
Anesthesia: General | Site: Arm Lower | Laterality: Left

## 2021-02-06 MED ORDER — FENTANYL CITRATE (PF) 100 MCG/2ML IJ SOLN: 25.0000 ug | INTRAMUSCULAR | Status: DC | PRN

## 2021-02-06 MED ORDER — SODIUM CHLORIDE 0.9 % IV BOLUS
1000.0000 mL | Freq: Once | INTRAVENOUS | Status: AC
  Administered 2021-02-06: 1000 mL via INTRAVENOUS

## 2021-02-06 MED ORDER — SODIUM CHLORIDE 0.9 % IR SOLN
Status: DC | PRN
  Administered 2021-02-06 (×2): 3000 mL

## 2021-02-06 MED ORDER — ACETAMINOPHEN 500 MG PO TABS: 1000.0000 mg | ORAL_TABLET | Freq: Once | ORAL | Status: DC

## 2021-02-06 MED ORDER — MIDAZOLAM HCL 2 MG/2ML IJ SOLN
INTRAMUSCULAR | Status: AC
  Filled 2021-02-06: qty 2

## 2021-02-06 MED ORDER — FENTANYL CITRATE (PF) 100 MCG/2ML IJ SOLN
INTRAMUSCULAR | Status: DC | PRN
  Administered 2021-02-06 (×2): 25 ug via INTRAVENOUS
  Administered 2021-02-06: 50 ug via INTRAVENOUS

## 2021-02-06 MED ORDER — DOXYCYCLINE HYCLATE 50 MG PO CAPS: 100.0000 mg | ORAL_CAPSULE | Freq: Two times a day (BID) | ORAL | 0 refills | Status: AC

## 2021-02-06 MED ORDER — TETANUS-DIPHTH-ACELL PERTUSSIS 5-2.5-18.5 LF-MCG/0.5 IM SUSY
0.5000 mL | PREFILLED_SYRINGE | Freq: Once | INTRAMUSCULAR | Status: AC
  Administered 2021-02-06: 0.5 mL via INTRAMUSCULAR
  Filled 2021-02-06: qty 0.5

## 2021-02-06 MED ORDER — LIDOCAINE HCL (CARDIAC) PF 100 MG/5ML IV SOSY
PREFILLED_SYRINGE | INTRAVENOUS | Status: DC | PRN
  Administered 2021-02-06: 40 mg via INTRAVENOUS

## 2021-02-06 MED ORDER — FENTANYL CITRATE (PF) 100 MCG/2ML IJ SOLN
50.0000 ug | Freq: Once | INTRAMUSCULAR | Status: AC
  Administered 2021-02-06: 50 ug via INTRAVENOUS
  Filled 2021-02-06: qty 2

## 2021-02-06 MED ORDER — CEFAZOLIN SODIUM-DEXTROSE 2-4 GM/100ML-% IV SOLN
INTRAVENOUS | Status: AC
  Filled 2021-02-06: qty 100

## 2021-02-06 MED ORDER — LIDOCAINE 2% (20 MG/ML) 5 ML SYRINGE
INTRAMUSCULAR | Status: AC
  Filled 2021-02-06: qty 5

## 2021-02-06 MED ORDER — CHLORHEXIDINE GLUCONATE 4 % EX LIQD: 60.0000 mL | Freq: Once | CUTANEOUS | Status: DC

## 2021-02-06 MED ORDER — OXYCODONE HCL 5 MG/5ML PO SOLN: 5.0000 mg | Freq: Once | ORAL | Status: DC | PRN

## 2021-02-06 MED ORDER — BUPIVACAINE HCL (PF) 0.25 % IJ SOLN
INTRAMUSCULAR | Status: DC | PRN
Start: 2021-02-06 — End: 2021-02-06
  Administered 2021-02-06: 10 mL

## 2021-02-06 MED ORDER — BUPIVACAINE HCL (PF) 0.25 % IJ SOLN
INTRAMUSCULAR | Status: AC
  Filled 2021-02-06: qty 10

## 2021-02-06 MED ORDER — ONDANSETRON HCL 4 MG PO TABS: 4.0000 mg | ORAL_TABLET | Freq: Three times a day (TID) | ORAL | 0 refills | Status: DC | PRN

## 2021-02-06 MED ORDER — FENTANYL CITRATE (PF) 100 MCG/2ML IJ SOLN
50.0000 ug | Freq: Once | INTRAMUSCULAR | Status: AC
Start: 2021-02-06 — End: 2021-02-06
  Administered 2021-02-06: 50 ug via INTRAVENOUS
  Filled 2021-02-06: qty 2

## 2021-02-06 MED ORDER — OXYCODONE HCL 5 MG PO TABS: 5.0000 mg | ORAL_TABLET | Freq: Once | ORAL | Status: DC | PRN

## 2021-02-06 MED ORDER — PROMETHAZINE HCL 25 MG/ML IJ SOLN: 6.2500 mg | INTRAMUSCULAR | Status: DC | PRN

## 2021-02-06 MED ORDER — CEFAZOLIN SODIUM-DEXTROSE 2-4 GM/100ML-% IV SOLN
2.0000 g | INTRAVENOUS | Status: AC
  Administered 2021-02-06: 2 g via INTRAVENOUS

## 2021-02-06 MED ORDER — CEFAZOLIN SODIUM-DEXTROSE 2-4 GM/100ML-% IV SOLN
2.0000 g | Freq: Once | INTRAVENOUS | Status: AC
  Administered 2021-02-06: 2 g via INTRAVENOUS
  Filled 2021-02-06: qty 100

## 2021-02-06 MED ORDER — ONDANSETRON HCL 4 MG/2ML IJ SOLN
4.0000 mg | Freq: Once | INTRAMUSCULAR | Status: AC
  Administered 2021-02-06: 4 mg via INTRAVENOUS
  Filled 2021-02-06: qty 2

## 2021-02-06 MED ORDER — LACTATED RINGERS IV SOLN: INTRAVENOUS | Status: DC | PRN

## 2021-02-06 MED ORDER — CHLORHEXIDINE GLUCONATE 0.12 % MT SOLN
OROMUCOSAL | Status: AC
  Administered 2021-02-06: 15 mL
  Filled 2021-02-06: qty 15

## 2021-02-06 MED ORDER — POVIDONE-IODINE 10 % EX SWAB: 2.0000 "application " | Freq: Once | CUTANEOUS | Status: DC

## 2021-02-06 MED ORDER — OXYCODONE HCL 5 MG PO TABS: 5.0000 mg | ORAL_TABLET | Freq: Four times a day (QID) | ORAL | 0 refills | Status: DC | PRN

## 2021-02-06 MED ORDER — ONDANSETRON HCL 4 MG/2ML IJ SOLN
INTRAMUSCULAR | Status: DC | PRN
  Administered 2021-02-06: 4 mg via INTRAVENOUS

## 2021-02-06 MED ORDER — PROPOFOL 10 MG/ML IV BOLUS
INTRAVENOUS | Status: DC | PRN
  Administered 2021-02-06: 200 mg via INTRAVENOUS

## 2021-02-06 MED ORDER — 0.9 % SODIUM CHLORIDE (POUR BTL) OPTIME
TOPICAL | Status: DC | PRN
  Administered 2021-02-06: 1000 mL

## 2021-02-06 MED ORDER — FENTANYL CITRATE (PF) 250 MCG/5ML IJ SOLN
INTRAMUSCULAR | Status: AC
  Filled 2021-02-06: qty 5

## 2021-02-06 MED ORDER — PROPOFOL 10 MG/ML IV BOLUS
INTRAVENOUS | Status: AC
  Filled 2021-02-06: qty 20

## 2021-02-06 MED ORDER — DEXMEDETOMIDINE (PRECEDEX) IN NS 20 MCG/5ML (4 MCG/ML) IV SYRINGE
PREFILLED_SYRINGE | INTRAVENOUS | Status: DC | PRN
  Administered 2021-02-06: 12 ug via INTRAVENOUS

## 2021-02-06 MED ORDER — PHENYLEPHRINE HCL (PRESSORS) 10 MG/ML IV SOLN
INTRAVENOUS | Status: DC | PRN
  Administered 2021-02-06: 40 ug via INTRAVENOUS

## 2021-02-06 SURGICAL SUPPLY — 48 items
BAG COUNTER SPONGE SURGICOUNT (BAG) ×2 IMPLANT
BAG SPNG CNTER NS LX DISP (BAG) ×1
BNDG COHESIVE 4X5 TAN STRL (GAUZE/BANDAGES/DRESSINGS) IMPLANT
BNDG ELASTIC 4X5.8 VLCR STR LF (GAUZE/BANDAGES/DRESSINGS) ×2 IMPLANT
BNDG ELASTIC 6X5.8 VLCR STR LF (GAUZE/BANDAGES/DRESSINGS) IMPLANT
BNDG GAUZE ELAST 4 BULKY (GAUZE/BANDAGES/DRESSINGS) ×2 IMPLANT
BOOTCOVER CLEANROOM LRG (PROTECTIVE WEAR) IMPLANT
COVER SURGICAL LIGHT HANDLE (MISCELLANEOUS) ×2 IMPLANT
CUFF TOURN SGL QUICK 34 (TOURNIQUET CUFF)
CUFF TRNQT CYL 34X4.125X (TOURNIQUET CUFF) IMPLANT
DRSG PAD ABDOMINAL 8X10 ST (GAUZE/BANDAGES/DRESSINGS) ×2 IMPLANT
DURAPREP 26ML APPLICATOR (WOUND CARE) IMPLANT
ELECT REM PT RETURN 9FT ADLT (ELECTROSURGICAL) ×2
ELECTRODE REM PT RTRN 9FT ADLT (ELECTROSURGICAL) ×1 IMPLANT
EVACUATOR 1/8 PVC DRAIN (DRAIN) IMPLANT
GAUZE SPONGE 4X4 12PLY STRL (GAUZE/BANDAGES/DRESSINGS) IMPLANT
GAUZE SPONGE 4X4 12PLY STRL LF (GAUZE/BANDAGES/DRESSINGS) ×2 IMPLANT
GAUZE XEROFORM 1X8 LF (GAUZE/BANDAGES/DRESSINGS) IMPLANT
GAUZE XEROFORM 5X9 LF (GAUZE/BANDAGES/DRESSINGS) ×2 IMPLANT
GLOVE SURG ENC MOIS LTX SZ7 (GLOVE) ×2 IMPLANT
GLOVE SURG ORTHO LTX SZ7.5 (GLOVE) ×2 IMPLANT
GLOVE SURG UNDER POLY LF SZ7 (GLOVE) ×2 IMPLANT
GOWN STRL REUS W/ TWL LRG LVL3 (GOWN DISPOSABLE) ×3 IMPLANT
GOWN STRL REUS W/TWL LRG LVL3 (GOWN DISPOSABLE) ×6
HANDPIECE INTERPULSE COAX TIP (DISPOSABLE) ×2
KIT BASIN OR (CUSTOM PROCEDURE TRAY) ×2 IMPLANT
KIT TURNOVER KIT B (KITS) ×2 IMPLANT
MANIFOLD NEPTUNE II (INSTRUMENTS) ×2 IMPLANT
NEEDLE HYPO 25GX1X1/2 BEV (NEEDLE) ×2 IMPLANT
NS IRRIG 1000ML POUR BTL (IV SOLUTION) ×2 IMPLANT
PACK ORTHO EXTREMITY (CUSTOM PROCEDURE TRAY) ×2 IMPLANT
PAD ARMBOARD 7.5X6 YLW CONV (MISCELLANEOUS) ×4 IMPLANT
SET CYSTO W/LG BORE CLAMP LF (SET/KITS/TRAYS/PACK) ×2 IMPLANT
SET HNDPC FAN SPRY TIP SCT (DISPOSABLE) ×1 IMPLANT
SPONGE T-LAP 18X18 ~~LOC~~+RFID (SPONGE) IMPLANT
STOCKINETTE IMPERVIOUS 9X36 MD (GAUZE/BANDAGES/DRESSINGS) ×2 IMPLANT
SUCTION FRAZIER HANDLE 10FR (MISCELLANEOUS) ×2
SUCTION TUBE FRAZIER 10FR DISP (MISCELLANEOUS) ×1 IMPLANT
SUT ETHILON 3 0 PS 1 (SUTURE) ×2 IMPLANT
SUT PROLENE 6 0 BV (SUTURE) ×2 IMPLANT
SWAB CULTURE ESWAB REG 1ML (MISCELLANEOUS) IMPLANT
SYR CONTROL 10ML LL (SYRINGE) ×2 IMPLANT
TOWEL GREEN STERILE (TOWEL DISPOSABLE) ×2 IMPLANT
TOWEL GREEN STERILE FF (TOWEL DISPOSABLE) ×2 IMPLANT
TUBE CONNECTING 12X1/4 (SUCTIONS) ×2 IMPLANT
UNDERPAD 30X36 HEAVY ABSORB (UNDERPADS AND DIAPERS) ×2 IMPLANT
WATER STERILE IRR 1000ML POUR (IV SOLUTION) ×2 IMPLANT
YANKAUER SUCT BULB TIP NO VENT (SUCTIONS) ×2 IMPLANT
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
## 2021-02-06 NOTE — Transfer of Care (Signed)
Immediate Anesthesia Transfer of Care Note  Patient: Timothy Gray  Procedure(s) Performed: IRRIGATION AND DEBRIDEMENT  OF FOREARM (Left: Arm Lower)  Patient Location: PACU  Anesthesia Type:General  Level of Consciousness: drowsy  Airway & Oxygen Therapy: Patient Spontanous Breathing and Patient connected to nasal cannula oxygen  Post-op Assessment: Report given to RN, Post -op Vital signs reviewed and stable and Patient moving all extremities  Post vital signs: Reviewed and stable  Last Vitals:  Vitals Value Taken Time  BP 104/66 02/06/21 2155  Temp    Pulse 57 02/06/21 2155  Resp 13 02/06/21 2155  SpO2 100 % 02/06/21 2155  Vitals shown include unvalidated device data.  Last Pain:  Vitals:   02/06/21 2021  TempSrc: Oral  PainSc: 7          Complications: No notable events documented.

## 2021-02-06 NOTE — Anesthesia Preprocedure Evaluation (Addendum)
Anesthesia Evaluation  Patient identified by MRN, date of birth, ID bandGeneral Assessment Comment: Drowsy but easily arousable  Reviewed: Allergy & Precautions, NPO status , Patient's Chart, lab work & pertinent test results  History of Anesthesia Complications Negative for: history of anesthetic complications  Airway Mallampati: II  TM Distance: >3 FB Neck ROM: Full    Dental  (+) Dental Advisory Given   Pulmonary Current SmokerPatient did not abstain from smoking.,    Pulmonary exam normal        Cardiovascular negative cardio ROS   Rhythm:Regular Rate:Tachycardia     Neuro/Psych negative neurological ROS  negative psych ROS   GI/Hepatic negative GI ROS, (+)     substance abuse  marijuana use,   Endo/Other  negative endocrine ROS K 3.1   Renal/GU negative Renal ROS     Musculoskeletal negative musculoskeletal ROS (+)   Abdominal   Peds  Hematology  (+) anemia ,  Plt 122k    Anesthesia Other Findings   Reproductive/Obstetrics                            Anesthesia Physical Anesthesia Plan  ASA: 2  Anesthesia Plan: General   Post-op Pain Management:    Induction: Intravenous  PONV Risk Score and Plan: 2 and Treatment may vary due to age or medical condition, Ondansetron, Dexamethasone and Midazolam  Airway Management Planned: LMA  Additional Equipment: None  Intra-op Plan:   Post-operative Plan: Extubation in OR  Informed Consent: I have reviewed the patients History and Physical, chart, labs and discussed the procedure including the risks, benefits and alternatives for the proposed anesthesia with the patient or authorized representative who has indicated his/her understanding and acceptance.     Dental advisory given  Plan Discussed with: CRNA and Anesthesiologist  Anesthesia Plan Comments:        Anesthesia Quick Evaluation
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
## 2021-02-06 NOTE — Discharge Instructions (Addendum)
Diet: As you were doing prior to hospitalization   Shower:  May shower but keep the wounds dry, use an occlusive plastic wrap, NO SOAKING IN TUB.  If the bandage gets wet, change with a clean dry gauze.   Dressing:  You may change your dressing 3-5 days after surgery, unless you have a splint.  If you have a splint, then just leave the splint in place and we will change your bandages during your first follow-up appointment.    Activity:  Increase activity slowly as tolerated, but follow the weight bearing instructions below.  The rules on driving is that you can not be taking narcotics while you drive, and you must feel in control of the vehicle.    Weight Bearing:   Non weight bearing with left hand until follow up with hand surgery.   To prevent constipation: you may use a stool softener such as -  Colace (over the counter) 100 mg by mouth twice a day  Drink plenty of fluids (prune juice may be helpful) and high fiber foods Miralax (over the counter) for constipation as needed.    Itching:  If you experience itching with your medications, try taking only a single pain pill, or even half a pain pill at a time.  You may take up to 10 pain pills per day, and you can also use benadryl over the counter for itching or also to help with sleep.   Precautions:  If you experience chest pain or shortness of breath - call 911 immediately for transfer to the hospital emergency department!!  If you develop a fever greater that 101 F, purulent drainage from wound, increased redness or drainage from wound, or calf pain -- Call the office at [phone]                                                Follow- Up Appointment:  Please see follow up section

## 2021-02-06 NOTE — Anesthesia Procedure Notes (Signed)
Procedure Name: LMA Insertion Date/Time: 02/06/2021 8:36 PM Performed by: Coline Calkin T, CRNA Pre-anesthesia Checklist: Patient identified, Emergency Drugs available, Suction available and Patient being monitored Patient Re-evaluated:Patient Re-evaluated prior to induction Oxygen Delivery Method: Circle system utilized Preoxygenation: Pre-oxygenation with 100% oxygen Induction Type: IV induction Ventilation: Mask ventilation without difficulty LMA: LMA inserted LMA Size: 5.0 Number of attempts: 1 Placement Confirmation: positive ETCO2 and breath sounds checked- equal and bilateral Tube secured with: Tape Dental Injury: Teeth and Oropharynx as per pre-operative assessment

## 2021-02-06 NOTE — Anesthesia Postprocedure Evaluation (Signed)
Anesthesia Post Note  Patient: Timothy Gray  Procedure(s) Performed: IRRIGATION AND DEBRIDEMENT  OF FOREARM (Left: Arm Lower)     Patient location during evaluation: PACU Anesthesia Type: General Level of consciousness: awake and alert Pain management: pain level controlled Vital Signs Assessment: post-procedure vital signs reviewed and stable Respiratory status: spontaneous breathing, nonlabored ventilation and respiratory function stable Cardiovascular status: stable and blood pressure returned to baseline Anesthetic complications: no   No notable events documented.  Last Vitals:  Vitals:   02/06/21 2155 02/06/21 2210  BP: 104/66 106/71  Pulse: 70 64  Resp: 13 13  Temp: 37.3 C   SpO2:  100%    Last Pain:  Vitals:   02/06/21 2155  TempSrc:   PainSc: Asleep                 Beryle Lathe

## 2021-02-07 ENCOUNTER — Encounter (HOSPITAL_COMMUNITY): Payer: Self-pay | Admitting: Orthopedic Surgery

## 2021-02-07 LAB — TYPE AND SCREEN
ABO/RH(D): A POS
Antibody Screen: NEGATIVE
Donor AG Type: NEGATIVE
Donor AG Type: NEGATIVE
Unit division: 0
Unit division: 0

## 2021-02-07 LAB — BPAM RBC
Blood Product Expiration Date: 202208282359
Blood Product Expiration Date: 202209022359
Unit Type and Rh: 6200
Unit Type and Rh: 6200

## 2021-02-10 ENCOUNTER — Encounter (HOSPITAL_COMMUNITY): Payer: Self-pay | Admitting: Orthopedic Surgery

## 2021-02-11 NOTE — Discharge Summary (Addendum)
Discharge Summary  Patient ID: Timothy Gray MRN: [id number] DOB/AGE: [birth year] y.o.  Admit date: 02/06/2021 Discharge date: 02/07/21  Admission Diagnoses:  Chainsaw injury, left forearm  Discharge Diagnoses:  Active Problems:   Contact with chainsaw as cause of accidental injury   History reviewed. No pertinent past medical history.  Surgeries: Procedure(s): IRRIGATION AND DEBRIDEMENT  OF FOREARM on 02/06/2021   Consultants (if any): Treatment Team:  Teryl Lucy, MD  Discharged Condition: Improved  Hospital Course: Timothy Gray is an 35 y.o. male who was admitted 02/06/2021 with a diagnosis of Left forearm chainsaw injury and went to the operating room on 02/06/2021 and underwent the above named procedures.    He was given perioperative antibiotics:  Anti-infectives (From admission, onward)    Start     Dose/Rate Route Frequency Ordered Stop   02/07/21 0600  ceFAZolin (ANCEF) IVPB 2g/100 mL premix        2 g 200 mL/hr over 30 Minutes Intravenous On call to O.R. 02/06/21 2027 02/06/21 2042   02/06/21 2006  ceFAZolin (ANCEF) 2-4 GM/100ML-% IVPB       Note to Pharmacy: Shanda Bumps   : cabinet override      02/06/21 2006 02/06/21 2047   02/06/21 1345  ceFAZolin (ANCEF) IVPB 2g/100 mL premix        2 g 200 mL/hr over 30 Minutes Intravenous  Once 02/06/21 1334 02/06/21 1630   02/06/21 0000  doxycycline (VIBRAMYCIN) 50 MG capsule        100 mg Oral 2 times daily 02/06/21 2151 02/16/21 2359     .  He was given sequential compression devices and early ambulation for DVT prophylaxis.  He benefited maximally from the hospital stay and there were no complications.    Recent vital signs:  Vitals:   02/06/21 2350 02/07/21 0005  BP: 118/70 119/80  Pulse: 66 70  Resp: 14 12  Temp:  98.7 F (37.1 C)  SpO2: 100% 100%    Recent laboratory studies:  Lab Results  Component Value Date   HGB 12.8 (L) 02/06/2021   HGB 9.7 (L) 03/12/2019   HGB 10.8 (L) 03/11/2019    Lab Results  Component Value Date   WBC 8.6 02/06/2021   PLT 122 (L) 02/06/2021   Lab Results  Component Value Date   INR 1.1 03/10/2019   Lab Results  Component Value Date   NA 137 02/06/2021   K 3.1 (L) 02/06/2021   CL 100 02/06/2021   CO2 25 02/06/2021   BUN 13 02/06/2021   CREATININE 1.26 (H) 02/06/2021   GLUCOSE 83 02/06/2021    Discharge Medications:   Allergies as of 02/07/2021   No Known Allergies      Medication List     TAKE these medications    doxycycline 50 MG capsule Commonly known as: VIBRAMYCIN Take 2 capsules (100 mg total) by mouth 2 (two) times daily for 10 days.   ondansetron 4 MG tablet Commonly known as: Zofran Take 1 tablet (4 mg total) by mouth every 8 (eight) hours as needed for nausea or vomiting.   oxyCODONE 5 MG immediate release tablet Commonly known as: Roxicodone Take 1 tablet (5 mg total) by mouth every 6 (six) hours as needed for severe pain. What changed:  how much to take when to take this reasons to take this        Diagnostic Studies: DG Forearm Left  Result Date: 02/06/2021 CLINICAL DATA:  Laceration caused by change are. EXAM: LEFT  FOREARM - 2 VIEW COMPARISON:  None. FINDINGS: There is no evidence of fracture or other focal bone lesions. Laceration is seen involving the soft tissues of the distal forearm. No radiopaque foreign body is noted. IMPRESSION: Soft tissue laceration is seen involving distal forearm. No fracture or dislocation is noted. Electronically Signed   By: Lupita Raider M.D.   On: 02/06/2021 14:21    Disposition: Discharge disposition: 01-Home or Self Care          Follow-up Information     Dominica Severin, MD. Schedule an appointment as soon as possible for a visit.   Specialty: Orthopedic Surgery Why: schedule as soon as possible for hand surgery follow up Contact information: [address] [place] [address] [phone]         Teryl Lucy, MD Follow up.    Specialty: Orthopedic Surgery Why: Office number of Dr. Dion Saucier who did your original surgery. Please call our office if you have any questions., As needed Contact information: [address] [place] [address] [phone]                  Signed: Armida Sans PA-C 02/11/2021, 11:18 AM

## 2021-02-14 ENCOUNTER — Other Ambulatory Visit: Payer: Self-pay

## 2021-02-14 ENCOUNTER — Encounter (HOSPITAL_COMMUNITY): Payer: Self-pay | Admitting: Orthopedic Surgery

## 2021-02-14 NOTE — Progress Notes (Signed)
PCP - denies Cardiologist - denies EKG -  Chest x-ray -  ECHO -  Cardiac Cath -    ERAS Protcol - clears 1145 DOS  COVID TEST- DOS  Anesthesia review: n/a  -------------  SDW INSTRUCTIONS:  Your procedure is scheduled on Tuesday 8/9. Please report to [facility] Main Entrance "A" at 1415 P.M., and check in at the Admitting office. Call this number if you have problems the morning of surgery: [phone]   Remember: Do not eat after midnight the night before your surgery  You may drink clear liquids until 11:45 AM the morning of your surgery.   Clear liquids allowed are: Water, Non-Citrus Juices (without pulp), Carbonated Beverages, Clear Tea, Black Coffee Only, and Gatorade   Medications to take morning of surgery with a sip of water include: doxycycline (VIBRAMYCIN) ondansetron (ZOFRAN) oxyCODONE (ROXICODONE)   As of today, STOP taking any Aspirin (unless otherwise instructed by your surgeon), Aleve, Naproxen, Ibuprofen, Motrin, Advil, Goody's, BC's, all herbal medications, fish oil, and all vitamins.    The Morning of Surgery Do not wear jewelry Do not wear lotions, powders, colognes, or deodorant Men may shave face and neck. Do not bring valuables to the hospital. [facility] is not responsible for any belongings or valuables.  If you are a smoker, DO NOT Smoke 24 hours prior to surgery  If you wear a CPAP at night please bring your mask the morning of surgery   Remember that you must have someone to transport you home after your surgery, and remain with you for 24 hours if you are discharged the same day.  Please bring cases for contacts, glasses, hearing aids, dentures or bridgework because it cannot be worn into surgery.   Patients discharged the day of surgery will not be allowed to drive home.   Please shower the NIGHT BEFORE/MORNING OF SURGERY (use antibacterial soap like DIAL soap if possible). Wear comfortable clothes the morning of surgery. Oral Hygiene  is also important to reduce your risk of infection.  Remember - BRUSH YOUR TEETH THE MORNING OF SURGERY WITH YOUR REGULAR TOOTHPASTE  Patient denies shortness of breath, fever, cough and chest pain.

## 2021-02-18 ENCOUNTER — Encounter (HOSPITAL_COMMUNITY)
Admission: RE | Disposition: A | Discharge status: Home / Self Care | Payer: Self-pay | Source: Home / Self Care | Attending: Orthopedic Surgery

## 2021-02-18 ENCOUNTER — Ambulatory Visit (HOSPITAL_COMMUNITY): Payer: Self-pay | Admitting: Certified Registered Nurse Anesthetist

## 2021-02-18 ENCOUNTER — Ambulatory Visit (HOSPITAL_COMMUNITY)
Admission: RE | Admit: 2021-02-18 | Discharge: 2021-02-18 | Disposition: A | Discharge status: Home / Self Care | Payer: Self-pay | Attending: Orthopedic Surgery | Admitting: Orthopedic Surgery

## 2021-02-18 ENCOUNTER — Encounter (HOSPITAL_COMMUNITY): Payer: Self-pay | Admitting: Orthopedic Surgery

## 2021-02-18 ENCOUNTER — Other Ambulatory Visit: Payer: Self-pay

## 2021-02-18 DIAGNOSIS — S56122A Laceration of flexor muscle, fascia and tendon of left index finger at forearm level, initial encounter: Secondary | ICD-10-CM | POA: Insufficient documentation

## 2021-02-18 DIAGNOSIS — W293XXA Contact with powered garden and outdoor hand tools and machinery, initial encounter: Secondary | ICD-10-CM | POA: Insufficient documentation

## 2021-02-18 DIAGNOSIS — S56124A Laceration of flexor muscle, fascia and tendon of left middle finger at forearm level, initial encounter: Secondary | ICD-10-CM | POA: Insufficient documentation

## 2021-02-18 DIAGNOSIS — S4402XA Injury of ulnar nerve at upper arm level, left arm, initial encounter: Secondary | ICD-10-CM | POA: Insufficient documentation

## 2021-02-18 DIAGNOSIS — F17210 Nicotine dependence, cigarettes, uncomplicated: Secondary | ICD-10-CM | POA: Insufficient documentation

## 2021-02-18 DIAGNOSIS — Z20822 Contact with and (suspected) exposure to covid-19: Secondary | ICD-10-CM | POA: Insufficient documentation

## 2021-02-18 HISTORY — PX: INCISION AND DRAINAGE WOUND WITH NERVE REPAIR: SHX5637

## 2021-02-18 LAB — SARS CORONAVIRUS 2 BY RT PCR (HOSPITAL ORDER, PERFORMED IN ~~LOC~~ HOSPITAL LAB): SARS Coronavirus 2: NEGATIVE

## 2021-02-18 SURGERY — INCISION AND DRAINAGE WOUND WITH NERVE REPAIR
Anesthesia: General | Laterality: Left

## 2021-02-18 MED ORDER — CEFAZOLIN SODIUM-DEXTROSE 2-4 GM/100ML-% IV SOLN
2.0000 g | INTRAVENOUS | Status: AC
  Administered 2021-02-18: 2 g via INTRAVENOUS
  Filled 2021-02-18: qty 100

## 2021-02-18 MED ORDER — ONDANSETRON HCL 4 MG/2ML IJ SOLN: 4.0000 mg | Freq: Once | INTRAMUSCULAR | Status: DC | PRN

## 2021-02-18 MED ORDER — FENTANYL CITRATE (PF) 100 MCG/2ML IJ SOLN
INTRAMUSCULAR | Status: AC
  Filled 2021-02-18: qty 2

## 2021-02-18 MED ORDER — "VISTASEAL 4 ML SINGLE DOSE KIT "
4.0000 mL | PACK | Freq: Once | CUTANEOUS | Status: DC
  Filled 2021-02-18 (×2): qty 4

## 2021-02-18 MED ORDER — OXYCODONE HCL 5 MG/5ML PO SOLN: 5.0000 mg | Freq: Once | ORAL | Status: DC | PRN

## 2021-02-18 MED ORDER — DEXAMETHASONE SODIUM PHOSPHATE 10 MG/ML IJ SOLN
INTRAMUSCULAR | Status: DC | PRN
  Administered 2021-02-18: 10 mg via INTRAVENOUS

## 2021-02-18 MED ORDER — PROPOFOL 10 MG/ML IV BOLUS
INTRAVENOUS | Status: DC | PRN
  Administered 2021-02-18: 200 mg via INTRAVENOUS

## 2021-02-18 MED ORDER — MIDAZOLAM HCL 2 MG/2ML IJ SOLN
INTRAMUSCULAR | Status: DC | PRN
Start: 2021-02-18 — End: 2021-02-18
  Administered 2021-02-18: 2 mg via INTRAVENOUS

## 2021-02-18 MED ORDER — FENTANYL CITRATE (PF) 250 MCG/5ML IJ SOLN
INTRAMUSCULAR | Status: DC | PRN
  Administered 2021-02-18 (×10): 25 ug via INTRAVENOUS

## 2021-02-18 MED ORDER — LIDOCAINE 2% (20 MG/ML) 5 ML SYRINGE
INTRAMUSCULAR | Status: DC | PRN
  Administered 2021-02-18: 60 mg via INTRAVENOUS

## 2021-02-18 MED ORDER — CHLORHEXIDINE GLUCONATE 0.12 % MT SOLN
15.0000 mL | OROMUCOSAL | Status: AC
  Administered 2021-02-18: 15 mL via OROMUCOSAL
  Filled 2021-02-18 (×2): qty 15

## 2021-02-18 MED ORDER — FENTANYL CITRATE (PF) 250 MCG/5ML IJ SOLN
INTRAMUSCULAR | Status: AC
  Filled 2021-02-18: qty 5

## 2021-02-18 MED ORDER — OXYCODONE HCL 5 MG PO TABS: 5.0000 mg | ORAL_TABLET | Freq: Once | ORAL | Status: DC | PRN

## 2021-02-18 MED ORDER — CHLORHEXIDINE GLUCONATE 0.12 % MT SOLN: 15.0000 mL | Freq: Once | OROMUCOSAL | Status: DC

## 2021-02-18 MED ORDER — OXYCODONE HCL 5 MG PO TABS: 10.0000 mg | ORAL_TABLET | ORAL | 0 refills | Status: DC | PRN

## 2021-02-18 MED ORDER — CEPHALEXIN 500 MG PO CAPS: 500.0000 mg | ORAL_CAPSULE | Freq: Four times a day (QID) | ORAL | 0 refills | Status: AC

## 2021-02-18 MED ORDER — TISSEEL VH 4 ML EX KIT: PACK | Freq: Once | CUTANEOUS | Status: DC

## 2021-02-18 MED ORDER — MIDAZOLAM HCL 2 MG/2ML IJ SOLN
INTRAMUSCULAR | Status: AC
  Filled 2021-02-18: qty 2

## 2021-02-18 MED ORDER — LACTATED RINGERS IV SOLN: INTRAVENOUS | Status: DC

## 2021-02-18 MED ORDER — BUPIVACAINE HCL (PF) 0.25 % IJ SOLN
INTRAMUSCULAR | Status: DC | PRN
  Administered 2021-02-18: 20 mL

## 2021-02-18 MED ORDER — BUPIVACAINE HCL (PF) 0.25 % IJ SOLN
INTRAMUSCULAR | Status: AC
  Filled 2021-02-18: qty 30

## 2021-02-18 MED ORDER — "FIBRIN SEALANT 5 ML SINGLE DOSE KIT ": 5.0000 mL | PACK | Freq: Once | CUTANEOUS | Status: DC

## 2021-02-18 MED ORDER — PROMETHAZINE HCL 12.5 MG PO TABS: 12.5000 mg | ORAL_TABLET | Freq: Four times a day (QID) | ORAL | 0 refills | Status: DC | PRN

## 2021-02-18 MED ORDER — METHOCARBAMOL 500 MG PO TABS
500.0000 mg | ORAL_TABLET | Freq: Four times a day (QID) | ORAL | 0 refills | Status: DC | PRN
Start: 2021-02-18 — End: 2021-09-05

## 2021-02-18 MED ORDER — ORAL CARE MOUTH RINSE: 15.0000 mL | Freq: Once | OROMUCOSAL | Status: DC

## 2021-02-18 MED ORDER — SODIUM CHLORIDE 0.9 % IR SOLN
Status: DC | PRN
  Administered 2021-02-18: 1000 mL

## 2021-02-18 MED ORDER — FENTANYL CITRATE (PF) 100 MCG/2ML IJ SOLN
25.0000 ug | INTRAMUSCULAR | Status: DC | PRN
  Administered 2021-02-18: 50 ug via INTRAVENOUS
  Administered 2021-02-18: 25 ug via INTRAVENOUS

## 2021-02-18 SURGICAL SUPPLY — 45 items
BAG COUNTER SPONGE SURGICOUNT (BAG) ×2 IMPLANT
BNDG CONFORM 2 STRL LF (GAUZE/BANDAGES/DRESSINGS) IMPLANT
BNDG ELASTIC 4X5.8 VLCR STR LF (GAUZE/BANDAGES/DRESSINGS) ×2 IMPLANT
BNDG GAUZE ELAST 4 BULKY (GAUZE/BANDAGES/DRESSINGS) IMPLANT
CONNECTOR NERVE AXOGUARD 7X15 (Nerve Graft) ×4 IMPLANT
CORD BIPOLAR FORCEPS 12FT (ELECTRODE) ×2 IMPLANT
COVER SURGICAL LIGHT HANDLE (MISCELLANEOUS) ×2 IMPLANT
CUFF TOURN SGL QUICK 18X4 (TOURNIQUET CUFF) ×2 IMPLANT
DRSG ADAPTIC 3X8 NADH LF (GAUZE/BANDAGES/DRESSINGS) ×2 IMPLANT
GAUZE SPONGE 4X4 12PLY STRL (GAUZE/BANDAGES/DRESSINGS) ×2 IMPLANT
GAUZE SPONGE 4X4 12PLY STRL LF (GAUZE/BANDAGES/DRESSINGS) ×2 IMPLANT
GAUZE XEROFORM 1X8 LF (GAUZE/BANDAGES/DRESSINGS) ×2 IMPLANT
GAUZE XEROFORM 5X9 LF (GAUZE/BANDAGES/DRESSINGS) ×2 IMPLANT
GLOVE SURG ENC TEXT LTX SZ8 (GLOVE) ×2 IMPLANT
GLOVE SURG MICRO LTX SZ8 (GLOVE) ×2 IMPLANT
GOWN STRL REUS W/ TWL LRG LVL3 (GOWN DISPOSABLE) ×1 IMPLANT
GOWN STRL REUS W/ TWL XL LVL3 (GOWN DISPOSABLE) ×2 IMPLANT
GOWN STRL REUS W/TWL LRG LVL3 (GOWN DISPOSABLE) ×1
GOWN STRL REUS W/TWL XL LVL3 (GOWN DISPOSABLE) ×2
GRAFT NERVE AVANCE 4-5X50 (Nerve Graft) ×2 IMPLANT
KIT BASIN OR (CUSTOM PROCEDURE TRAY) ×2 IMPLANT
KIT TURNOVER KIT B (KITS) ×2 IMPLANT
MANIFOLD NEPTUNE II (INSTRUMENTS) ×2 IMPLANT
NEEDLE HYPO 25GX1X1/2 BEV (NEEDLE) IMPLANT
NS IRRIG 1000ML POUR BTL (IV SOLUTION) ×2 IMPLANT
PACK ORTHO EXTREMITY (CUSTOM PROCEDURE TRAY) ×2 IMPLANT
PAD ARMBOARD 7.5X6 YLW CONV (MISCELLANEOUS) IMPLANT
PAD CAST 4YDX4 CTTN HI CHSV (CAST SUPPLIES) ×1 IMPLANT
PADDING CAST COTTON 4X4 STRL (CAST SUPPLIES) ×2
SET CYSTO W/LG BORE CLAMP LF (SET/KITS/TRAYS/PACK) ×2 IMPLANT
SOL PREP POV-IOD 4OZ 10% (MISCELLANEOUS) ×4 IMPLANT
SPLINT FIBERGLASS 4X30 (CAST SUPPLIES) ×2 IMPLANT
SPONGE T-LAP 4X18 ~~LOC~~+RFID (SPONGE) ×2 IMPLANT
SUT ETHILON 8 0 BV130 4 (SUTURE) ×4 IMPLANT
SUT FIBERWIRE 3-0 18 DIAM 3/8 (SUTURE) ×2
SUT PROLENE 3 0 PS 1 (SUTURE) ×8 IMPLANT
SUT PROLENE 6 0 P 3 18 (SUTURE) ×2 IMPLANT
SUTURE FIBERWR 3-0 18 DIAM 3/8 (SUTURE) ×1 IMPLANT
SWAB CULTURE ESWAB REG 1ML (MISCELLANEOUS) IMPLANT
SYR CONTROL 10ML LL (SYRINGE) IMPLANT
TOWEL GREEN STERILE (TOWEL DISPOSABLE) ×2 IMPLANT
TOWEL GREEN STERILE FF (TOWEL DISPOSABLE) ×2 IMPLANT
TUBE CONNECTING 12X1/4 (SUCTIONS) ×2 IMPLANT
WATER STERILE IRR 1000ML POUR (IV SOLUTION) IMPLANT
YANKAUER SUCT BULB TIP NO VENT (SUCTIONS) ×2 IMPLANT

## 2021-02-18 NOTE — Discharge Instructions (Signed)

## 2021-02-18 NOTE — Anesthesia Preprocedure Evaluation (Signed)
Anesthesia Evaluation  Patient identified by MRN, date of birth, ID bandGeneral Assessment Comment: Drowsy but easily arousable  Reviewed: Allergy & Precautions, NPO status , Patient's Chart, lab work & pertinent test results  History of Anesthesia Complications Negative for: history of anesthetic complications  Airway Mallampati: II  TM Distance: >3 FB Neck ROM: Full    Dental no notable dental hx. (+) Dental Advisory Given   Pulmonary Current SmokerPatient did not abstain from smoking.,    Pulmonary exam normal breath sounds clear to auscultation       Cardiovascular negative cardio ROS Normal cardiovascular exam Rhythm:Regular Rate:Normal     Neuro/Psych negative neurological ROS  negative psych ROS   GI/Hepatic negative GI ROS, (+)     substance abuse  marijuana use,   Endo/Other  negative endocrine ROS K 3.1   Renal/GU negative Renal ROS  negative genitourinary   Musculoskeletal Tendon and nerve injury left forearm- chainsaw injury   Abdominal   Peds  Hematology  (+) anemia ,  Plt 122k    Anesthesia Other Findings   Reproductive/Obstetrics                             Anesthesia Physical  Anesthesia Plan  ASA: 2  Anesthesia Plan: General   Post-op Pain Management:    Induction: Intravenous  PONV Risk Score and Plan: 2 and Treatment may vary due to age or medical condition, Ondansetron and Midazolam  Airway Management Planned: LMA  Additional Equipment: None  Intra-op Plan:   Post-operative Plan: Extubation in OR  Informed Consent: I have reviewed the patients History and Physical, chart, labs and discussed the procedure including the risks, benefits and alternatives for the proposed anesthesia with the patient or authorized representative who has indicated his/her understanding and acceptance.     Dental advisory given  Plan Discussed with: CRNA and  Anesthesiologist  Anesthesia Plan Comments:         Anesthesia Quick Evaluation

## 2021-02-18 NOTE — Progress Notes (Signed)
Inquired about pt potassium (see results) prior to surgery on 02/18/21. Per Dr. Malen Gauze, no follow up labs needed. No further orders.
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
## 2021-02-18 NOTE — Anesthesia Procedure Notes (Signed)
Procedure Name: LMA Insertion Date/Time: 02/18/2021 4:13 PM Performed by: Carlos American, CRNA Pre-anesthesia Checklist: Patient identified, Emergency Drugs available, Suction available and Patient being monitored Patient Re-evaluated:Patient Re-evaluated prior to induction Oxygen Delivery Method: Circle System Utilized Preoxygenation: Pre-oxygenation with 100% oxygen Induction Type: IV induction Ventilation: Mask ventilation without difficulty LMA: LMA inserted LMA Size: 5.0 Number of attempts: 1 Placement Confirmation: positive ETCO2 Tube secured with: Tape Dental Injury: Teeth and Oropharynx as per pre-operative assessment

## 2021-02-18 NOTE — Transfer of Care (Signed)
Immediate Anesthesia Transfer of Care Note  Patient: Timothy Gray  Procedure(s) Performed: Irrigation debridement and left ulnar nerve repair reconstruction   Anterior interosseous nerve transfer to the deep motor branch of the ulnar nerve.  Flexor digitorum profundus tenodesis and repair as necessary (Left)  Patient Location: PACU  Anesthesia Type:General  Level of Consciousness: sedated  Airway & Oxygen Therapy: Patient Spontanous Breathing  Post-op Assessment: Report given to RN and Post -op Vital signs reviewed and stable  Post vital signs: Reviewed and stable  Last Vitals:  Vitals Value Taken Time  BP 113/68 02/18/21 1904  Temp    Pulse 94 02/18/21 1904  Resp 8 02/18/21 1904  SpO2 100 % 02/18/21 1904  Vitals shown include unvalidated device data.  Last Pain:  Vitals:   02/18/21 1521  TempSrc: Oral         Complications: No notable events documented.
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
## 2021-02-18 NOTE — Anesthesia Postprocedure Evaluation (Signed)
Anesthesia Post Note  Patient: Teran Knittle  Procedure(s) Performed: Irrigation debridement and left ulnar nerve repair reconstruction   Anterior interosseous nerve transfer to the deep motor branch of the ulnar nerve.  Flexor digitorum profundus tenodesis and repair as necessary (Left)     Patient location during evaluation: PACU Anesthesia Type: General Level of consciousness: awake and alert Pain management: pain level controlled Vital Signs Assessment: post-procedure vital signs reviewed and stable Respiratory status: spontaneous breathing, nonlabored ventilation, respiratory function stable and patient connected to nasal cannula oxygen Cardiovascular status: blood pressure returned to baseline and stable Postop Assessment: no apparent nausea or vomiting Anesthetic complications: no   No notable events documented.  Last Vitals:  Vitals:   02/18/21 1935 02/18/21 1950  BP: 115/79 123/79  Pulse: 100 92  Resp: 12 11  Temp:  36.6 C  SpO2: 98% 100%    Last Pain:  Vitals:   02/18/21 1935  TempSrc:   PainSc: Asleep                 Shelton Silvas

## 2021-02-20 ENCOUNTER — Encounter (HOSPITAL_COMMUNITY): Payer: Self-pay | Admitting: Orthopedic Surgery

## 2021-02-25 ENCOUNTER — Encounter (HOSPITAL_COMMUNITY): Payer: Self-pay | Admitting: Orthopedic Surgery

## 2021-05-04 DIAGNOSIS — F1120 Opioid dependence, uncomplicated: Secondary | ICD-10-CM

## 2021-07-13 HISTORY — PX: MINOR AMPUTATION OF DIGIT: SHX6242

## 2021-09-04 ENCOUNTER — Other Ambulatory Visit: Payer: Self-pay

## 2021-09-04 ENCOUNTER — Encounter (HOSPITAL_COMMUNITY): Payer: Self-pay | Admitting: Psychiatry

## 2021-09-04 ENCOUNTER — Inpatient Hospital Stay (HOSPITAL_COMMUNITY)
Admission: AD | Admit: 2021-09-04 | Discharge: 2021-09-08 | DRG: 885 | Disposition: A | Discharge status: Home / Self Care | Payer: Federal, State, Local not specified - Other | Source: Intra-hospital | Attending: Psychiatry | Admitting: Psychiatry

## 2021-09-04 DIAGNOSIS — F339 Major depressive disorder, recurrent, unspecified: Principal | ICD-10-CM | POA: Diagnosis present

## 2021-09-04 DIAGNOSIS — R451 Restlessness and agitation: Secondary | ICD-10-CM | POA: Diagnosis not present

## 2021-09-04 DIAGNOSIS — F17210 Nicotine dependence, cigarettes, uncomplicated: Secondary | ICD-10-CM | POA: Diagnosis present

## 2021-09-04 DIAGNOSIS — F330 Major depressive disorder, recurrent, mild: Secondary | ICD-10-CM | POA: Diagnosis not present

## 2021-09-04 DIAGNOSIS — F1290 Cannabis use, unspecified, uncomplicated: Secondary | ICD-10-CM | POA: Diagnosis present

## 2021-09-04 DIAGNOSIS — R630 Anorexia: Secondary | ICD-10-CM | POA: Diagnosis present

## 2021-09-04 DIAGNOSIS — R45851 Suicidal ideations: Secondary | ICD-10-CM | POA: Diagnosis present

## 2021-09-04 DIAGNOSIS — F419 Anxiety disorder, unspecified: Secondary | ICD-10-CM | POA: Diagnosis present

## 2021-09-04 DIAGNOSIS — Z23 Encounter for immunization: Secondary | ICD-10-CM

## 2021-09-04 DIAGNOSIS — Z681 Body mass index (BMI) 19 or less, adult: Secondary | ICD-10-CM

## 2021-09-04 DIAGNOSIS — F1120 Opioid dependence, uncomplicated: Secondary | ICD-10-CM | POA: Diagnosis present

## 2021-09-04 MED ORDER — INFLUENZA VAC SPLIT QUAD 0.5 ML IM SUSY
0.5000 mL | PREFILLED_SYRINGE | INTRAMUSCULAR | Status: AC
  Administered 2021-09-05: 0.5 mL via INTRAMUSCULAR
  Filled 2021-09-04: qty 0.5

## 2021-09-04 MED ORDER — LORAZEPAM 1 MG PO TABS: 1.0000 mg | ORAL_TABLET | ORAL | Status: DC | PRN

## 2021-09-04 MED ORDER — PNEUMOCOCCAL VAC POLYVALENT 25 MCG/0.5ML IJ INJ
0.5000 mL | INJECTION | INTRAMUSCULAR | Status: AC
  Administered 2021-09-05: 0.5 mL via INTRAMUSCULAR
  Filled 2021-09-04: qty 0.5

## 2021-09-04 MED ORDER — ZIPRASIDONE MESYLATE 20 MG IM SOLR: 20.0000 mg | Freq: Two times a day (BID) | INTRAMUSCULAR | Status: DC | PRN

## 2021-09-04 MED ORDER — TRAZODONE HCL 50 MG PO TABS
50.0000 mg | ORAL_TABLET | Freq: Every evening | ORAL | Status: DC | PRN
  Administered 2021-09-04: 50 mg via ORAL
  Filled 2021-09-04 (×2): qty 1

## 2021-09-04 MED ORDER — MAGNESIUM HYDROXIDE 400 MG/5ML PO SUSP: 30.0000 mL | Freq: Every day | ORAL | Status: DC | PRN

## 2021-09-04 MED ORDER — ACETAMINOPHEN 325 MG PO TABS: 650.0000 mg | ORAL_TABLET | Freq: Four times a day (QID) | ORAL | Status: DC | PRN

## 2021-09-04 MED ORDER — ALUM & MAG HYDROXIDE-SIMETH 200-200-20 MG/5ML PO SUSP: 30.0000 mL | ORAL | Status: DC | PRN

## 2021-09-04 NOTE — Group Note (Signed)
Date:  09/04/2021 Time:  4:28 PM  Group Topic/Focus:  Wellness Toolbox:   The focus of this group is to discuss various aspects of wellness, balancing those aspects and exploring ways to increase the ability to experience wellness.  Patients will create a wellness toolbox for use upon discharge.    Participation Level:  Did Not Attend  Participation Quality:    Affect:   Cognitive:    Insight:   Engagement in Group:    Modes of Intervention:    Additional Comments:    Jaquita Rector 09/04/2021, 4:28 PM

## 2021-09-04 NOTE — Tx Team (Signed)
Initial Treatment Plan 09/04/2021 5:59 PM [name] [id number]    PATIENT STRESSORS: Substance abuse   Traumatic event  (brother committed suicide last year). Loss of significant relationship (brother's death)   PATIENT STRENGTHS: Capable of independent living  Electronics engineer  Supportive family/friends  Work skills    PATIENT IDENTIFIED PROBLEMS: Alterations in mood (Anxiety & depression) "I get very anxious most times I don't know the triggers. Sometimes it's just seeing certain things on TV".    Substance Abuse "I've been using Fentanyl for 2 years now. I last used 4 days ago".    Alterations in sleep pattern "I have been sleeping for longer hours everyday.    Traumatic event (grief) "My brother shot himself last year. He had PTSD".         DISCHARGE CRITERIA:  Improved stabilization in mood, thinking, and/or behavior Verbal commitment to aftercare and medication compliance  PRELIMINARY DISCHARGE PLAN: Outpatient therapy Return to previous living arrangement Return to previous work or school arrangements  PATIENT/FAMILY INVOLVEMENT: This treatment plan has been presented to and reviewed with the patient, Timothy Gray. The patient have been given the opportunity to ask questions and make suggestions.  Keane Police, RN 09/04/2021, 5:59 PM

## 2021-09-04 NOTE — Progress Notes (Signed)
Adult Psychoeducational Group Note  Date:  09/04/2021 Time:  9:30 PM  Group Topic/Focus:  Wrap-Up Group:   The focus of this group is to help patients review their daily goal of treatment and discuss progress on daily workbooks.  Participation Level:  Active  Participation Quality:  Appropriate  Affect:  Appropriate  Cognitive:  Appropriate  Insight: Appropriate  Engagement in Group:  Engaged  Modes of Intervention:  Discussion  Additional Comments:  exercise expression color yellow. His day was 7. His goal was to get clean. Did he achieved his goal so far so good. His coping skills family occupying his time.  Lenice Llamas Long 09/04/2021, 9:30 PM

## 2021-09-04 NOTE — Progress Notes (Signed)
Pt is a 36 y/o Caucasian male admitted to [facility] from [facility] ED under IVC status for worsening depression and and SI. Per nursing report, pt lost his younger brother last year by suicide "my brother shot himself last Veterans Day" and he's verbalized SI & HI due to his frustrations from substance use. Pt was IVC by mom as she was concerned about him. Pt presents with flat affect, logical speech, fair eye contact and does forwards on interactions. However, he's was very fidgety / restless; very preoccupied about his medications "Xanax". Per pt "I am just tired of my drug use, I don't want to replace one drug for another that's why I said I rather die. I really don't want to hurt myself". Reports lack of insurance as a concern which has affected his access to seek care. Pt stated he was last admitted to [facility] when he was 36 y/o on CAU. Pt denies SI & HI, verbally contracts for safety. Denies history of sexual, verbal and physical abuse. Denies etoh use but confirms he's addicted to Fentanyl and last used 4 days ago. Reports last sobriety was "2 years ago when I was in prison for a year". Pt was cooperative with admission process. Skin assessment done and belongings search completed as per protocol. Multiple tattoos and scars (surgeries) noted all over pt's body.  Items deemed contraband secured in locker (cell phone, charger bank, lighter, etc). Rates his anxiety 7/10 & depression 3/10 "I just need help". Ambulatory to unit with a steady gait. Unit orientation done, routines discussed, care plan reviewed with pt and admission documents signed. Emotional support and reassurance offered to pt. Writer encouraged pt to voice concerns. Q 15 minutes safety checks initiated without self harm gestures or outburst. Fluids and snacks offered on arrival to unit, tolerated well. Pt awake in room. Denies concerns at this time.

## 2021-09-05 ENCOUNTER — Encounter (HOSPITAL_COMMUNITY): Payer: Self-pay

## 2021-09-05 ENCOUNTER — Encounter (HOSPITAL_COMMUNITY): Payer: Self-pay | Admitting: Psychiatry

## 2021-09-05 DIAGNOSIS — F330 Major depressive disorder, recurrent, mild: Secondary | ICD-10-CM

## 2021-09-05 LAB — TSH: TSH: 0.524 u[IU]/mL (ref 0.350–4.500)

## 2021-09-05 MED ORDER — NICOTINE POLACRILEX 2 MG MT GUM: 2.0000 mg | CHEWING_GUM | OROMUCOSAL | Status: DC | PRN

## 2021-09-05 MED ORDER — TRAZODONE HCL 100 MG PO TABS
100.0000 mg | ORAL_TABLET | Freq: Every evening | ORAL | Status: DC | PRN
  Administered 2021-09-07: 100 mg via ORAL
  Filled 2021-09-05 (×2): qty 1

## 2021-09-05 NOTE — Progress Notes (Signed)
D:  Karthik was in the day room much of the evening.  He attended evening wrap up group.  He reported anxiety 8/10 (10 the worst).  He denied SI/HI or AVH.  He was focused on the xanax they were giving him in the ED.  Encouraged him to talk with MD tomorrow about plans for his medication regimen.  Trazodone given prn per request.    A:  1:1 with RN for support and encouragement.  Medications given as ordered.  Q 15 minute checks maintained for safety.  Encouraged participation in group and unit activities.   R:  He is currently resting with his eyes closed and appears to be asleep.  He remains safe on the unit.

## 2021-09-05 NOTE — Group Note (Signed)
LCSW Group Therapy Note   Group Date: 09/05/2021 Start Time: 1300 End Time: 1400  Therapy Type: Group Therapy  Participation Level:  Did not attend   Description of Group: Patients received a worksheet with an outline of 2 faces. One side is designated for what the pt sees about themselves and the other is what others see. Pt's were asked to introduce themselves and share something they like about themself. Pts were then asked to draw, write or color how they view themselves as well as how they are viewed by others. CSW led discussion about the feelings and words associated with each side.   Patient Summary: Did not attend   Aram Beecham, LCSWA 09/05/2021  2:08 PM

## 2021-09-05 NOTE — Progress Notes (Signed)
Patient attend wrap up AA group. °

## 2021-09-05 NOTE — [facility] Suicide Risk Assessment (Cosign Needed)
Suicide Risk Assessment  Admission Assessment    [facility] Admission Suicide Risk Assessment   Nursing information obtained from:  Patient  Demographic factors:  Adolescent or young adult, Male, Caucasian, Low socioeconomic status  Current Mental Status:  Self-harm thoughts, NA  Loss Factors:  Decline in physical health (Multiple surgeries from substance abuse)  Historical Factors:  Impulsivity  Risk Reduction Factors:  Employed, Living with another person, especially a relative  Total Time spent with patient: 1 hour  Principal Problem: MDD (major depressive disorder), recurrent episode (HCC)  Diagnosis:  Principal Problem:   MDD (major depressive disorder), recurrent episode (HCC) Active Problems:   Opioid use disorder, moderate, dependence (HCC)  Subjective Data: (Per admission evaluation notes): This is an admission evaluation for this 36 year old male with prior hx of probable mental health issues & opioid use disorder. He is admitted to the [facility] from the [facility] with complain of worsening symptoms of depression & suicidal ideations after a recent relapse on an opioid drug. Patient was patient in this [facility] at the adolescent unit in 2004. However, during this evaluation, Izaiha denies any hx of mental health issues or diagnoses. He denies any hx of psychiatric admissions/treatments. He says he is currently not on any medications for mental health issues & does not have an outpatient mental health provider. He reports during this evaluation,  "I had asked my mom to take me to the hospital last Tuesday morning & she did. [facility] because of my drug addiction. I have been using heroin intravenously for two years.  Then, I got off of it on my own for 8 months. Then, I recently saw someone that I used to know. This person happened to have some fentanyl. I impulsively joined this individual & got high. This was 5 days ago. Then, I realized that I had messed up my  sobriety. I became very sad. So, I asked my mother to take me to the hospital so I can have a couple of days to get clean. I believe that I'm clean now. I'm ready to be discharged to my home. I'm not depressed. I do not have any anxiety symptoms. I'm not hearing any voices or seeing things other people could not hear or see. I'm not feeling delusional or paranoia. I'm not feeling suicidal or homicidal. I'm not planning to hurt myself or anyone else. My younger brother shot himself to death not too long ago. I know what that has done to my family. I'm just not going to repeat history. I relapsed on drug, felt bad, wished I was dead, that was it. It was a guilt trip. I need to go home & be with my children. I'm not interested in going to a rehabilitation program. I'm sleeping well. I do not have any complaints at this time. I want to go to re-group & work on my sobriety again. I do not drink alcohol".   Continued Clinical Symptoms:  Alcohol Use Disorder Identification Test Final Score (AUDIT): 0 The "Alcohol Use Disorders Identification Test", Guidelines for Use in Primary Care, Second Edition.  World Science writer [facility] [place]). Score between 0-7:  no or low risk or alcohol related problems. Score between 8-15:  moderate risk of alcohol related problems. Score between 16-19:  high risk of alcohol related problems. Score 20 or above:  warrants further diagnostic evaluation for alcohol dependence and treatment.  CLINICAL FACTORS:   Alcohol/Substance Abuse/Dependencies Previous Psychiatric Diagnoses and Treatments  Musculoskeletal: Strength & Muscle Tone: within normal limits Gait & Station: normal Patient leans: N/A  Psychiatric Specialty Exam:  Presentation  General Appearance: Disheveled  Eye Contact:Good  Speech:Clear and Coherent; Normal Rate  Speech Volume:Normal  Handedness:Right  Mood and Affect  Mood:-- (Patient currently denies any symptoms of depression or  anxiety.)  Affect:Flat  Thought Process  Thought Processes:Coherent  Descriptions of Associations:Intact  Orientation:Full (Time, Place and Person)  Thought Content:Logical  History of Schizophrenia/Schizoaffective disorder:No data recorded Duration of Psychotic Symptoms:No data recorded Hallucinations:Hallucinations: None  Ideas of Reference:None  Suicidal Thoughts:Suicidal Thoughts: No  Homicidal Thoughts:Homicidal Thoughts: No  Sensorium  Memory:Immediate Good; Recent Good; Remote Good  Judgment:Poor  Insight:Lacking  Executive Functions  Concentration:Fair  Attention Span:Fair  Recall:Good  Fund of Knowledge:Fair  Language:Good  Psychomotor Activity  Psychomotor Activity:Psychomotor Activity: Normal Assets  Assets:Communication Skills; Housing; Health and safety inspector; Physical Health; Social Support  Sleep  Sleep:Sleep: Fair Number of Hours of Sleep: 5.5  Physical Exam: See (PAA/H&P)  Blood pressure 103/75, pulse 94, temperature 98.3 F (36.8 C), temperature source Oral, resp. rate 18, height 5\' 10"  (1.778 m), weight 63 kg, SpO2 98 %. Body mass index is 19.94 kg/m.  COGNITIVE FEATURES THAT CONTRIBUTE TO RISK:  Closed-mindedness    SUICIDE RISK:   Mild:  Suicidal ideation of limited frequency, intensity, duration, and specificity.  There are no identifiable plans, no associated intent, mild dysphoria and related symptoms, good self-control (both objective and subjective assessment), few other risk factors, and identifiable protective factors, including available and accessible social support.  PLAN OF CARE:   Treatment Plan Summary: Daily contact with patient to assess and evaluate symptoms and progress in treatment and Medication management.    Treatment Plan/Recommendations: 1. Admit for crisis management and stabilization, estimated length of stay 3-5 days.    2. Medication management to reduce current symptoms to base line and improve  the patient's overall level of functioning: See [facility] for plan of care.   Patient at this time denies to be on any medications for depression or anxiety. He denies any symptoms of depression or anxiety. He denies any AVH, delusional thoughts or paranoia. He denies any SIHI, plans or intent. He currently denies any substance withdrawal symptoms. He is asking to be discharged to his home with family. He does not want to consider any referral to any substance abuse treatment program.   3. Treat health problems as indicated.  4. Develop treatment plan to decrease risk of relapse upon discharge and the need for readmission.  5. Psycho-social education regarding relapse prevention and self care.  6. Health care follow up as needed for medical problems.  7. Review, reconcile, and reinstate any pertinent home medications for other health issues where appropriate. 8. Call for consults with hospitalist for any additional specialty patient care services as needed.   I certify that inpatient services furnished can reasonably be expected to improve the patient's condition.   [facility], NP, pmhnp, fnp-bc 09/05/2021, 2:50 PM

## 2021-09-05 NOTE — BH IP Treatment Plan (Signed)
Interdisciplinary Treatment and Diagnostic Plan Update  09/05/2021 Deklyn Gibbon MRN: [id number]  Principal Diagnosis: MDD (major depressive disorder), recurrent episode (HCC)  Secondary Diagnoses: Principal Problem:   MDD (major depressive disorder), recurrent episode (HCC) Active Problems:   Opioid use disorder, moderate, dependence (HCC)   Current Medications:  Current Facility-Administered Medications  Medication Dose Route Frequency Provider Last Rate Last Admin   acetaminophen (TYLENOL) tablet 650 mg  650 mg Oral Q6H PRN Oneta Rack, NP       alum & mag hydroxide-simeth (MAALOX/MYLANTA) 200-200-20 MG/5ML suspension 30 mL  30 mL Oral Q4H PRN Oneta Rack, NP       ziprasidone (GEODON) injection 20 mg  20 mg Intramuscular Q12H PRN Oneta Rack, NP       And   LORazepam (ATIVAN) tablet 1 mg  1 mg Oral PRN Oneta Rack, NP       magnesium hydroxide (MILK OF MAGNESIA) suspension 30 mL  30 mL Oral Daily PRN Oneta Rack, NP       nicotine polacrilex (NICORETTE) gum 2 mg  2 mg Oral PRN Nira Conn A, NP       traZODone (DESYREL) tablet 50 mg  50 mg Oral QHS PRN Oneta Rack, NP   50 mg at 09/04/21 2220   PTA Medications: No medications prior to admission.    Patient Stressors: Substance abuse   Traumatic event    Patient Strengths: Capable of independent living  Education administrator  Supportive family/friends  Work skills   Treatment Modalities: Medication Management, Group therapy, Case management,  1 to 1 session with clinician, Psychoeducation, Recreational therapy.   Physician Treatment Plan for Primary Diagnosis: MDD (major depressive disorder), recurrent episode (HCC) Long Term Goal(s): Improvement in symptoms so as ready for discharge   Short Term Goals: Ability to identify and develop effective coping behaviors will improve Ability to maintain clinical measurements within normal limits will improve Ability to identify triggers  associated with substance abuse/mental health issues will improve Ability to identify changes in lifestyle to reduce recurrence of condition will improve Ability to verbalize feelings will improve Ability to disclose and discuss suicidal ideas Ability to demonstrate self-control will improve  Medication Management: Evaluate patient's response, side effects, and tolerance of medication regimen.  Therapeutic Interventions: 1 to 1 sessions, Unit Group sessions and Medication administration.  Evaluation of Outcomes: Progressing  Physician Treatment Plan for Secondary Diagnosis: Principal Problem:   MDD (major depressive disorder), recurrent episode (HCC) Active Problems:   Opioid use disorder, moderate, dependence (HCC)  Long Term Goal(s): Improvement in symptoms so as ready for discharge   Short Term Goals: Ability to identify and develop effective coping behaviors will improve Ability to maintain clinical measurements within normal limits will improve Ability to identify triggers associated with substance abuse/mental health issues will improve Ability to identify changes in lifestyle to reduce recurrence of condition will improve Ability to verbalize feelings will improve Ability to disclose and discuss suicidal ideas Ability to demonstrate self-control will improve     Medication Management: Evaluate patient's response, side effects, and tolerance of medication regimen.  Therapeutic Interventions: 1 to 1 sessions, Unit Group sessions and Medication administration.  Evaluation of Outcomes: Progressing   RN Treatment Plan for Primary Diagnosis: MDD (major depressive disorder), recurrent episode (HCC) Long Term Goal(s): Knowledge of disease and therapeutic regimen to maintain health will improve  Short Term Goals: Ability to participate in decision making will improve, Ability to verbalize feelings  will improve, and Ability to identify and develop effective coping behaviors will  improve  Medication Management: RN will administer medications as ordered by provider, will assess and evaluate patient's response and provide education to patient for prescribed medication. RN will report any adverse and/or side effects to prescribing provider.  Therapeutic Interventions: 1 on 1 counseling sessions, Psychoeducation, Medication administration, Evaluate responses to treatment, Monitor vital signs and CBGs as ordered, Perform/monitor CIWA, COWS, AIMS and Fall Risk screenings as ordered, Perform wound care treatments as ordered.  Evaluation of Outcomes: Progressing   LCSW Treatment Plan for Primary Diagnosis: MDD (major depressive disorder), recurrent episode (HCC) Long Term Goal(s): Safe transition to appropriate next level of care at discharge, Engage patient in therapeutic group addressing interpersonal concerns.  Short Term Goals: Engage patient in aftercare planning with referrals and resources, Increase social support, and Increase ability to appropriately verbalize feelings  Therapeutic Interventions: Assess for all discharge needs, 1 to 1 time with Social worker, Explore available resources and support systems, Assess for adequacy in community support network, Educate family and significant other(s) on suicide prevention, Complete Psychosocial Assessment, Interpersonal group therapy.  Evaluation of Outcomes: Progressing   Progress in Treatment: Attending groups: Yes. Participating in groups: Yes. Taking medication as prescribed: Yes. Toleration medication: Yes. Family/Significant other contact made: No, will contact:  mother Patient understands diagnosis: Yes. Discussing patient identified problems/goals with staff: Yes. Medical problems stabilized or resolved: Yes. Denies suicidal/homicidal ideation: Yes. Issues/concerns per patient self-inventory: No. Other: None  New problem(s) identified: No, Describe:  None  New Short Term/Long Term Goal(s):medication  stabilization, elimination of SI thoughts, development of comprehensive mental wellness plan.   Patient Goals:  "go home"  Discharge Plan or Barriers: Patient recently admitted. CSW will continue to follow and assess for appropriate referrals and possible discharge planning.   Reason for Continuation of Hospitalization: Medication stabilization Withdrawal symptoms  Estimated Length of Stay:3-5 days   Scribe for Treatment Team: Chrys Racer 09/05/2021 3:21 PM

## 2021-09-05 NOTE — Progress Notes (Signed)
°   09/05/21 0830  Psych Admission Type (Psych Patients Only)  Admission Status Involuntary  Psychosocial Assessment  Patient Complaints Anxiety  Eye Contact Fair  Facial Expression Anxious  Affect Appropriate to circumstance  Speech Logical/coherent  Interaction Assertive  Appearance/Hygiene Disheveled  Behavior Characteristics Appropriate to situation;Cooperative  Mood Pleasant;Anxious  Thought Process  Coherency WDL  Content WDL  Delusions None reported or observed  Perception WDL  Hallucination None reported or observed  Judgment Impaired  Confusion None  Danger to Self  Current suicidal ideation? Denies       COVID-19 Daily Checkoff  Have you had a fever (temp > 37.80C/100F)  in the past 24 hours?  No  If you have had runny nose, nasal congestion, sneezing in the past 24 hours, has it worsened? No  COVID-19 EXPOSURE  Have you traveled outside the state in the past 14 days? No  Have you been in contact with someone with a confirmed diagnosis of COVID-19 or PUI in the past 14 days without wearing appropriate PPE? No  Have you been living in the same home as a person with confirmed diagnosis of COVID-19 or a PUI (household contact)? No  Have you been diagnosed with COVID-19? No
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
## 2021-09-05 NOTE — [facility] Group Notes (Signed)
Patient did not attend the relaxation group. 

## 2021-09-05 NOTE — [facility] Group Notes (Signed)
Pt attended goals groups and stated his goal is to make his safety plan today
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
## 2021-09-05 NOTE — Group Note (Signed)
Recreation Therapy Group Note   Group Topic:Problem Solving  Group Date: 09/05/2021 Start Time: 0930 End Time: 3154 Facilitators: Caroll Rancher, LRT,CTRS Location: [address] Day[address]   Goal Area(s) Addresses:  Patient will effectively work with peer towards shared goal.  Patient will identify skills used to make activity successful.  Patient will identify how skills used during activity can be applied to reach post d/c goals.   Group Description: [employer]. In teams of 5-6, patients were given 25 small craft pipe cleaners. Using the materials provided, patients were instructed to compete again the opposing team(s) to build the tallest free-standing structure from floor level. The activity was timed; difficulty increased by Clinical research associate as Production designer, theatre/television/film continued.  Systematically resources were removed with additional directions for example, placing one arm behind their back, working in silence, and shape stipulations.   Affect/Mood: Appropriate   Participation Level: Engaged   Participation Quality: Independent   Behavior: Appropriate and Cooperative   Speech/Thought Process: Focused   Insight: Good   Judgement: Good   Modes of Intervention: Competitive Play   Patient Response to Interventions:  Engaged   Education Outcome:  Acknowledges education and In group clarification offered    Clinical Observations/Individualized Feedback: Pt was bright and engaged throughout.  Pt seemed to be determined to win the competition.  Pt worked well with peers and seemed to be enjoying himself.     Plan: Continue to engage patient in RT group sessions 2-3x/week.   Caroll Rancher, LRT,CTRS 09/05/2021 1:07 PM

## 2021-09-06 MED ORDER — ENSURE ENLIVE PO LIQD
237.0000 mL | Freq: Two times a day (BID) | ORAL | Status: DC | PRN
  Administered 2021-09-06 – 2021-09-07 (×3): 237 mL via ORAL
  Filled 2021-09-06 (×4): qty 237

## 2021-09-06 NOTE — Progress Notes (Signed)
[facility] Group Notes:  (Nursing/MHT/Case Management/Adjunct)  Date:  09/06/2021  Time:  2015 Type of Therapy:   wrap up group  Participation Level:  Active  Participation Quality:  Appropriate, Attentive, Sharing, and Supportive  Affect:  Appropriate  Cognitive:  Alert  Insight:  Improving  Engagement in Group:  Engaged  Modes of Intervention:  Clarification, Education, and Support  Summary of Progress/Problems: Positive thinking and positive change were discussed.   Johann Capers S 09/06/2021, 9:04 PM

## 2021-09-06 NOTE — BHH Group Notes (Signed)
.  Psychoeducational Group Note ° ° ° °Date:09/06/21 °Time: 1300-1400 ° ° ° °Purpose of Group: . The group focus' on teaching patients on how to identify their needs and their Life Skills:  A group where two lists are made. What people need and what are things that we do that are unhealthy. The lists are developed by the patients and it is explained that we often do the actions that are not healthy to get our list of needs met. ° °Goal:: to develop the coping skills needed to get their needs met ° °Participation Level:  did not attend °Rosaisela Jamroz A ° °

## 2021-09-06 NOTE — Progress Notes (Signed)
°   09/06/21 0038  Psych Admission Type (Psych Patients Only)  Admission Status Involuntary  Psychosocial Assessment  Patient Complaints Insomnia  Eye Contact Fair  Facial Expression Anxious  Affect Appropriate to circumstance  Speech Logical/coherent  Interaction Assertive  Motor Activity Other (Comment) (WDL)  Appearance/Hygiene Disheveled  Behavior Characteristics Appropriate to situation  Mood Pleasant  Thought Process  Coherency WDL  Content WDL  Delusions None reported or observed  Hallucination None reported or observed  Judgment Impaired  Confusion None  Danger to Self  Current suicidal ideation? Denies  Danger to Others  Danger to Others None reported or observed
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
## 2021-09-06 NOTE — Progress Notes (Signed)
DAR NOTE: Patient presents with anxious affect and mood.  Denies suicidal thoughts, pain, auditory and visual hallucinations.  Rates depression at 0, hopelessness at 0, and anxiety at 0.  Maintained on routine safety checks.  Support and encouragement offered as needed.  Attended group and participated.  States goal for today is "going home for my niece birthday."  Patient visible in milieu briefly with minimal interaction.  Patient is safe on and off the unit.

## 2021-09-06 NOTE — Group Note (Signed)
LCSW Group Therapy ° ° °Due to high patient acuity and staffing, group was unable to be held on 09/06/2021. The CSW supervisor as well as the on-duty AC was made aware. ° °Nasiah Polinsky T Tameah Mihalko LCSWA  °2:43 PM ° °
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
## 2021-09-07 DIAGNOSIS — F339 Major depressive disorder, recurrent, unspecified: Principal | ICD-10-CM

## 2021-09-07 NOTE — Progress Notes (Signed)
°   09/07/21 2325  Psych Admission Type (Psych Patients Only)  Admission Status Involuntary  Psychosocial Assessment  Patient Complaints None  Eye Contact Fair  Facial Expression Anxious  Affect Appropriate to circumstance  Speech Logical/coherent  Interaction Assertive  Motor Activity Fidgety  Appearance/Hygiene Disheveled  Behavior Characteristics Appropriate to situation  Mood Pleasant  Thought Process  Coherency WDL  Content WDL  Delusions None reported or observed  Perception WDL  Hallucination None reported or observed  Judgment Impaired  Confusion None  Danger to Self  Current suicidal ideation? Denies  Danger to Others  Danger to Others None reported or observed

## 2021-09-07 NOTE — Progress Notes (Signed)
°   09/07/21 0900  Psych Admission Type (Psych Patients Only)  Admission Status Involuntary  Psychosocial Assessment  Patient Complaints None  Eye Contact Fair  Facial Expression Anxious  Affect Appropriate to circumstance  Speech Logical/coherent  Interaction Assertive  Motor Activity Other (Comment) (WDL)  Appearance/Hygiene Disheveled  Behavior Characteristics Cooperative  Mood Pleasant  Thought Process  Coherency WDL  Content WDL  Delusions None reported or observed  Perception WDL  Hallucination None reported or observed  Judgment Impaired  Confusion None  Danger to Self  Current suicidal ideation? Denies  Danger to Others  Danger to Others None reported or observed

## 2021-09-07 NOTE — Group Note (Signed)
LCSW Group Therapy Note ° ° °Group Date: 09/07/2021 °Start Time: 1000 °End Time: 1100 ° ° °Type of Therapy and Topic:  Group Therapy:  ° °LCSW Group Therapy °  °  °Due to high patient acuity and staffing, group was unable to be held on 09/07/2021. The CSW supervisor as well as the on-duty AC was made aware. ° ° ° °Timothy Gray A Timothy Katayama, LCSW °09/07/2021  12:30 PM   ° °
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
## 2021-09-07 NOTE — [facility] Group Notes (Signed)
Adult Psychoeducational Group Not Date:  09/07/2021 Time:  1027-2536 Group Topic/Focus: PROGRESSIVE RELAXATION. Gray group where deep breathing is taught and tensing and relaxation muscle groups is used. Imagery is used as well.  Pts are asked to imagine 3 pillars that hold them up when they are not able to hold themselves up.  Participation Level:  Active  Participation Quality:  Appropriate  Affect:  Appropriate  Cognitive:  Oriented  Insight: Improving  Engagement in Group:  Engaged  Modes of Intervention:  Activity, Discussion, Education, and Support  Additional Comments:  Pt rates his energy at [facility] 10/10. Pt upset about the fact, he feels like he was lied to at the other hospital. Was able to hear in Gray 1:1 that he needs to be here due to his admitting to feeling suicidal at the hospital.   Timothy Gray

## 2021-09-07 NOTE — [facility] Group Notes (Signed)
Adult Psychoeducational Group Not Date:  09/07/2021 Time:  7782-4235 Group Topic/Focus: PROGRESSIVE RELAXATION. A group where deep breathing is taught and tensing and relaxation muscle groups is used. Imagery is used as well.  Pts are asked to imagine 3 pillars that hold them up when they are not able to hold themselves up.  Participation Level:  Active  Participation Quality:  Appropriate  Affect:  Appropriate  Cognitive:  Oriented  Insight: Improving  Engagement in Group:  Engaged  Modes of Intervention:  Activity, Discussion, Education, and Support  Additional Comments:  Pt rates his energy as a 10/10. States what holds him up is his family, cigarettes and God  Dione Housekeeper

## 2021-09-08 MED ORDER — NICOTINE POLACRILEX 2 MG MT GUM: 2.0000 mg | CHEWING_GUM | OROMUCOSAL | 0 refills | Status: AC | PRN

## 2021-09-08 MED ORDER — TRAZODONE HCL 100 MG PO TABS: 100.0000 mg | ORAL_TABLET | Freq: Every evening | ORAL | 0 refills | Status: AC | PRN
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
## 2021-09-08 NOTE — BHH Counselor (Signed)
CSW provided the Pt with a list of NA meetings in the [place] area.

## 2021-09-08 NOTE — Progress Notes (Signed)
°  [facility] Adult Case Management Discharge Plan :  Will you be returning to the same living situation after discharge:  Yes,  Home with mother  At discharge, do you have transportation home?: Yes,  Mother Do you have the ability to pay for your medications: Yes,  Employment   Release of information consent forms completed and in the chart;  Patient's signature needed at discharge.  Patient to Follow up at:  Follow-up Information     [facility], [facility] Follow up on 09/11/2021.   Why: You are scheduled for an intake appointment on 09/11/2021 at 9:00am. Contact information: [address] [place] [address] [phone]         [facility].   Contact information: Address: [address] [place], [place] [address]  Phone: [phone] Fax: [phone]        [facility]. Call.   Why: This agency is scheduled to open on 09/15/2021.  You may contact them at this time to schedule an appointment.  You will need to pay $100 for first visit and $50 for each "medical visit" there after. Contact information: Address: [address] [place], [place] [address]  Phone: [phone] Fax: [phone]                Next level of care provider has access to [facility] Link:no  Safety Planning and Suicide Prevention discussed: Yes,  with patient and mother      Has patient been referred to the Quitline?: Patient refused referral  Patient has been referred for addiction treatment: Yes  Aram Beecham, LCSWA 09/08/2021, 9:37 AM

## 2021-09-08 NOTE — BHH Suicide Risk Assessment (Cosign Needed)
Suicide Risk Assessment  Discharge Assessment    [facility] Discharge Suicide Risk Assessment   Principal Problem: MDD (major depressive disorder), recurrent episode ([facility]) Discharge Diagnoses: Principal Problem:   MDD (major depressive disorder), recurrent episode ([facility]) Active Problems:   Opioid use disorder, moderate, dependence ([facility])   Total Time spent with patient: 15 minutes  Musculoskeletal: Strength & Muscle Tone: within normal limits Gait & Station: normal Patient leans: N/A  Psychiatric Specialty Exam  Presentation  General Appearance: Appropriate for Environment; Casual; Fairly Groomed; Neat  Eye Contact:Good  Speech:Clear and Coherent; Normal Rate  Speech Volume:Normal  Handedness:Right  Mood and Affect  Mood:Euthymic  Duration of Depression Symptoms: No data recorded Affect:Appropriate; Congruent   Thought Process  Thought Processes:Coherent; Goal Directed  Descriptions of Associations:Intact  Orientation:Full (Time, Place and Person)  Thought Content:Logical; WDL  History of Schizophrenia/Schizoaffective disorder:No data recorded Duration of Psychotic Symptoms:No data recorded Hallucinations:Hallucinations: None  Ideas of Reference:None  Suicidal Thoughts:Suicidal Thoughts: No  Homicidal Thoughts:Homicidal Thoughts: No  Sensorium  Memory:Immediate Good; Recent Good; Remote Good  Judgment:Good  Insight:Fair  Executive Functions  Concentration:Good  Attention Span:Good  Recall:Good  Fund of Knowledge:Good  Language:Good  Psychomotor Activity  Psychomotor Activity:Psychomotor Activity: Normal  Assets  Assets: Communication, Personal Health  Sleep  Sleep:Sleep: Good Number of Hours of Sleep: 8  Physical Exam: Physical Exam Vitals and nursing note reviewed.  Constitutional:      Appearance: Normal appearance.  HENT:     Head: Normocephalic and atraumatic.     Right Ear: External ear normal.     Left Ear: External ear  normal.     Nose: Nose normal.     Mouth/Throat:     Mouth: Mucous membranes are moist.  Eyes:     Extraocular Movements: Extraocular movements intact.     Conjunctiva/sclera: Conjunctivae normal.     Pupils: Pupils are equal, round, and reactive to light.  Cardiovascular:     Rate and Rhythm: Normal rate.     Pulses: Normal pulses.  Pulmonary:     Effort: Pulmonary effort is normal.  Abdominal:     Palpations: Abdomen is soft.  Genitourinary:    Comments: Deferred Musculoskeletal:        General: Normal range of motion.     Cervical back: Normal range of motion and neck supple.  Skin:    General: Skin is warm.  Neurological:     General: No focal deficit present.     Mental Status: He is alert and oriented to person, place, and time.  Psychiatric:        Behavior: Behavior normal.   Review of Systems  Constitutional: Negative.  Negative for chills and fever.  HENT:  Negative for ear pain, hearing loss, sinus pain, sore throat and tinnitus.   Eyes: Negative.  Negative for blurred vision, double vision, pain, discharge and redness.  Respiratory:  Negative for cough, sputum production, shortness of breath and wheezing.   Cardiovascular: Negative.  Negative for chest pain and palpitations.  Gastrointestinal: Negative.  Negative for abdominal pain, constipation, diarrhea, heartburn, nausea and vomiting.  Genitourinary: Negative.  Negative for dysuria, frequency and urgency.  Musculoskeletal:  Negative for back pain, falls, myalgias and neck pain.  Skin: Negative.  Negative for itching and rash.  Neurological:  Negative for dizziness, tingling, tremors, sensory change, speech change, focal weakness, seizures, loss of consciousness, weakness and headaches.  Endo/Heme/Allergies: Negative.   Psychiatric/Behavioral:  Positive for depression (Stable upon discharge) and substance abuse (Has history). Negative  for memory loss. The patient is nervous/anxious (Stable upon discharge). The  patient does not have insomnia.   Blood pressure 109/71, pulse 95, temperature 97.7 F (36.5 C), temperature source Oral, resp. rate 16, height 5\' 10"  (1.778 m), weight 63 kg, SpO2 99 %. Body mass index is 19.94 kg/m.  Mental Status Per Nursing Assessment::   On Admission:  Self-harm thoughts, NA  Demographic Factors:  Male and Caucasian  Loss Factors: NA  Historical Factors: Family history of suicide, Anniversary of important loss, and Impulsivity  Risk Reduction Factors:   Responsible for children under 79 years of age, Sense of responsibility to family, Religious beliefs about death, Employed, Living with another person, especially a relative, Positive social support, Positive therapeutic relationship, and Positive coping skills or problem solving skills  Continued Clinical Symptoms:  Alcohol/Substance Abuse/Dependencies Obsessive-Compulsive Disorder More than one psychiatric diagnosis Previous Psychiatric Diagnoses and Treatments  Cognitive Features That Contribute To Risk:  None    Suicide Risk:  Mild:  Suicidal ideation of limited frequency, intensity, duration, and specificity.  There are no identifiable plans, no associated intent, mild dysphoria and related symptoms, good self-control (both objective and subjective assessment), few other risk factors, and identifiable protective factors, including available and accessible social support.   Follow-up Information     [facility] Follow up on 09/11/2021.   Why: You are scheduled for an intake appointment on 09/11/2021 at 9:00am. Contact information: [address] [place] [phone]         [facility].   Contact information: Address: [address] [place], [place]  Phone: [phone] Fax: [phone]        [facility]. Call.   Why: This agency is scheduled to open on 09/15/2021.  You may contact them at this time to schedule an  appointment.  You will need to pay $100 for first visit and $50 for each "medical visit" there after. Contact information: Address: [address] [place], [place]  Phone: [phone] Fax: [phone]                Plan Of Care/Follow-up recommendations:  Activity:  Discharge to [facility] Diet:  Regular as tolerated Decrease salt intake  [facility], FNP 09/08/2021, 11:10 AM

## 2021-09-08 NOTE — BHH Group Notes (Signed)
Orientation Group and Psychoeducational teaching. Patients were asked to share how they are feeling mentally today, and unit and ward rules, expectations were discussed. Psychoeducational teaching was then discussed with habits of mindfulness and positive reframing techniques to improve mental health. The patients were given a poem of the Dalia Lama and asked one technique to use to help calm anxiety and negative thoughts. The patient attended group.  °

## 2021-09-08 NOTE — BHH Suicide Risk Assessment (Signed)
[place] INPATIENT:  Family/Significant Other Suicide Prevention Education  Suicide Prevention Education:  Education Completed; Timothy Gray [phone] (Mother) has been identified by the patient as the family member/significant other with whom the patient will be residing, and identified as the person(s) who will aid the patient in the event of a mental health crisis (suicidal ideations/suicide attempt).  With written consent from the patient, the family member/significant other has been provided the following suicide prevention education, prior to the and/or following the discharge of the patient.  The suicide prevention education provided includes the following: Suicide risk factors Suicide prevention and interventions National Suicide Hotline telephone number [facility] assessment telephone number [facility] Emergency Assistance [place] and/or Residential Mobile Crisis Unit telephone number  Request made of family/significant other to: Remove weapons (e.g., guns, rifles, knives), all items previously/currently identified as safety concern.   Remove drugs/medications (over-the-counter, prescriptions, illicit drugs), all items previously/currently identified as a safety concern.  The family member/significant other verbalizes understanding of the suicide prevention education information provided.  The family member/significant other agrees to remove the items of safety concern listed above.  CSW spoke with Mrs. Timothy Gray who states that she believes her son had the Flu and was not feeling well when he came to the hospital.  She states that approximately 1 year ago his brother committed suicide and he has struggled with that loss emotionally.  She states that her son did express some suicidal statements but she states that he has not made any prior suicide attempts.  She states that there are no firearms in her home and that she feels safe with her son returning to her  home.  She states that she has no further concerns for his safety at this time.  CSW completed SPE with Mrs. Timothy Gray.   Timothy Gray 09/08/2021, 9:47 AM

## 2021-09-08 NOTE — Progress Notes (Signed)
Pt discharged to lobby. Pt was stable and appreciative at that time. All papers and prescriptions were given and valuables returned. Verbal understanding expressed. Denies SI/HI and A/VH. Pt given opportunity to express concerns and ask questions.  

## 2022-02-01 IMAGING — CR DG FOREARM 2V*L*
2 series · 2 of 2 positions shown · non-contrast
Comparison: None.

CLINICAL DATA: Laceration caused by change are.

EXAM:
LEFT FOREARM - 2 VIEW

[forearm ap]
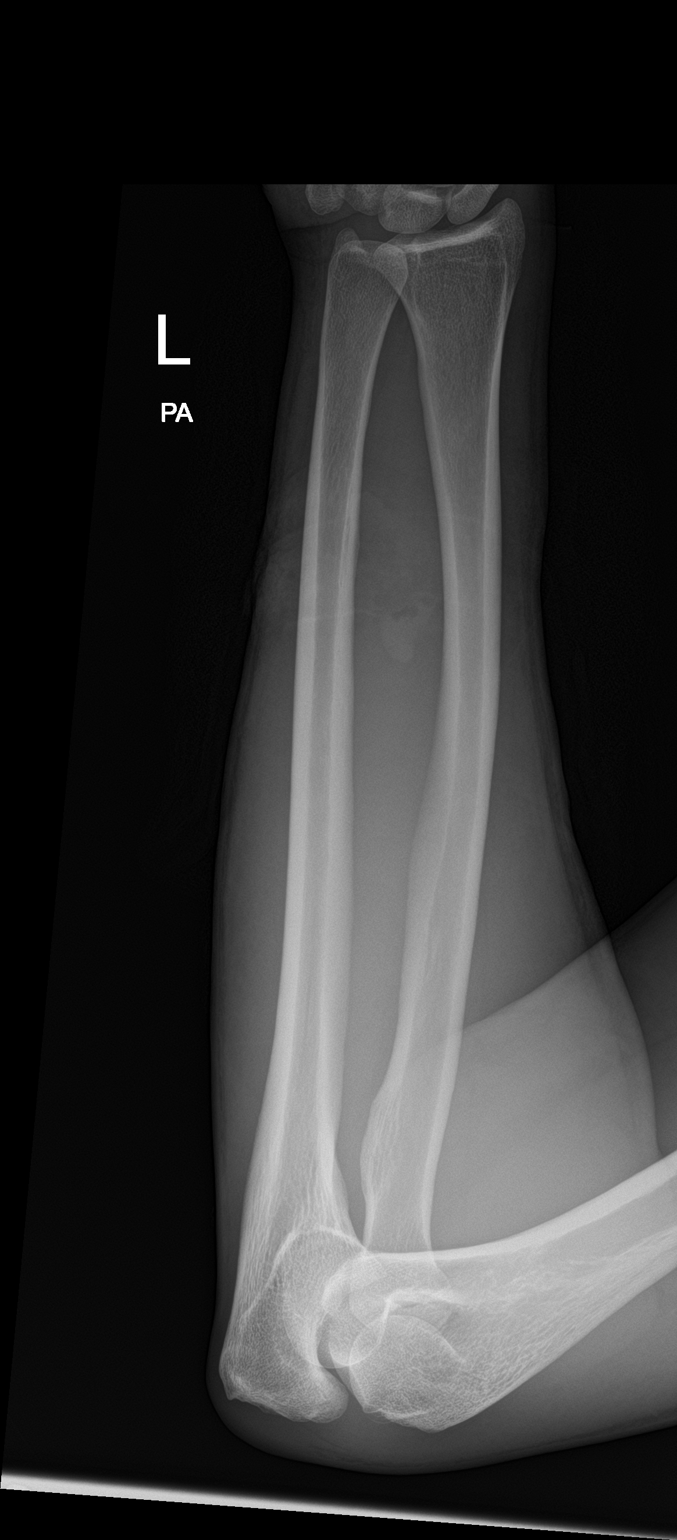

[forearm lat]
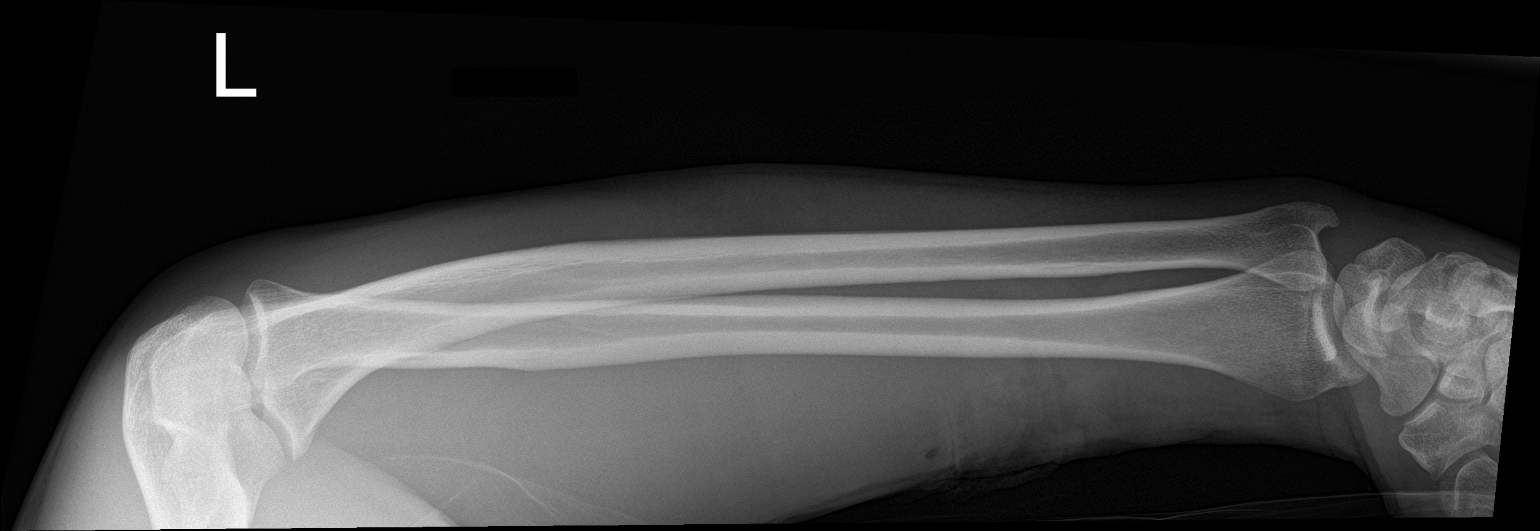

[2 of 2 positions shown; findings below may reference images not displayed]

FINDINGS: There is no evidence of fracture or other focal bone lesions.
Laceration is seen involving the soft tissues of the distal forearm.
No radiopaque foreign body is noted.
IMPRESSION: Soft tissue laceration is seen involving distal forearm. No fracture
or dislocation is noted.

## 2022-07-31 ENCOUNTER — Emergency Department (HOSPITAL_COMMUNITY)
Admission: EM | Admit: 2022-07-31 | Discharge: 2022-08-01 | Disposition: A | Discharge status: Home / Self Care | Payer: Commercial Managed Care - HMO | Attending: Emergency Medicine | Admitting: Emergency Medicine

## 2022-07-31 ENCOUNTER — Encounter (HOSPITAL_COMMUNITY): Payer: Self-pay

## 2022-07-31 ENCOUNTER — Other Ambulatory Visit: Payer: Self-pay

## 2022-07-31 DIAGNOSIS — G8929 Other chronic pain: Secondary | ICD-10-CM | POA: Diagnosis not present

## 2022-07-31 DIAGNOSIS — M545 Low back pain, unspecified: Secondary | ICD-10-CM | POA: Diagnosis present

## 2022-07-31 DIAGNOSIS — M546 Pain in thoracic spine: Secondary | ICD-10-CM | POA: Diagnosis not present

## 2022-07-31 HISTORY — DX: Rider (driver) (passenger) of other motorcycle injured in unspecified traffic accident, initial encounter: V29.99XA

## 2022-07-31 MED ORDER — METHOCARBAMOL 500 MG PO TABS
500.0000 mg | ORAL_TABLET | Freq: Once | ORAL | Status: AC
  Administered 2022-07-31: 500 mg via ORAL
  Filled 2022-07-31: qty 1

## 2022-07-31 NOTE — ED Provider Triage Note (Addendum)
Emergency Medicine Provider Triage Evaluation Note  Timothy Gray , a 37 y.o. male  was evaluated in triage.  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  Walking okay, no fevers. No numbness. +hx of IVDU.   Review of Systems  Positive: Back pain Negative: Fever   Physical Exam  BP (!) 98/53 (BP Location: Right Arm)   Pulse 60   Temp 97.8 F (36.6 C)   Resp 16   Ht 5\' 10"  (1.778 m)   Wt 70.3 kg   SpO2 98%   BMI 22.24 kg/m  Gen:   Awake, no distress   Resp:  Normal effort  MSK:   Moves extremities without difficulty  Other:    Medical Decision Making  Medically screening exam initiated at 11:21 PM.  Appropriate orders placed.  Timothy Gray was informed that the remainder of the evaluation will be completed by another provider, this initial triage assessment does not replace that evaluation, and the importance of remaining in the ED until their evaluation is complete.  Urine sample    Timothy Gray, [place] 07/31/22 2322    Timothy Gray, [place] 07/31/22 2324

## 2022-07-31 NOTE — ED Triage Notes (Signed)
Pt presents with lumbosacral pain x 2 months without radiation. Pt reports gradual worsening of the pain with the worst being today. Pt denies urinary or fecal incontinence.

## 2022-08-01 ENCOUNTER — Emergency Department (HOSPITAL_COMMUNITY): Payer: Commercial Managed Care - HMO

## 2022-08-01 DIAGNOSIS — M546 Pain in thoracic spine: Secondary | ICD-10-CM | POA: Diagnosis not present

## 2022-08-01 LAB — COMPREHENSIVE METABOLIC PANEL
ALT: 38 U/L (ref 0–44)
AST: 35 U/L (ref 15–41)
Albumin: 3.8 g/dL (ref 3.5–5.0)
Alkaline Phosphatase: 48 U/L (ref 38–126)
Anion gap: 9 (ref 5–15)
BUN: 10 mg/dL (ref 6–20)
CO2: 25 mmol/L (ref 22–32)
Calcium: 9.1 mg/dL (ref 8.9–10.3)
Chloride: 101 mmol/L (ref 98–111)
Creatinine, Ser: 0.84 mg/dL (ref 0.61–1.24)
GFR, Estimated: >60 mL/min (ref >60)
Glucose, Bld: 135 mg/dL — ABNORMAL HIGH (ref 70–99)
Potassium: 3.8 mmol/L (ref 3.5–5.1)
Sodium: 135 mmol/L (ref 135–145)
Total Bilirubin: 0.4 mg/dL (ref 0.3–1.2)
Total Protein: 7.2 g/dL (ref 6.5–8.1)

## 2022-08-01 LAB — URINALYSIS, ROUTINE W REFLEX MICROSCOPIC
Bilirubin Urine: NEGATIVE
Glucose, UA: NEGATIVE mg/dL
Hgb urine dipstick: NEGATIVE
Ketones, ur: NEGATIVE mg/dL
Leukocytes,Ua: NEGATIVE
Nitrite: NEGATIVE
Protein, ur: NEGATIVE mg/dL
Specific Gravity, Urine: 1.015 (ref 1.005–1.030)
pH: 5 (ref 5.0–8.0)

## 2022-08-01 LAB — CBC
HCT: 33.4 % — ABNORMAL LOW (ref 39.0–52.0)
Hemoglobin: 11.1 g/dL — ABNORMAL LOW (ref 13.0–17.0)
MCH: 28.7 pg (ref 26.0–34.0)
MCHC: 33.2 g/dL (ref 30.0–36.0)
MCV: 86.3 fL (ref 80.0–100.0)
Platelets: 177 10*3/uL (ref 150–400)
RBC: 3.87 MIL/uL — ABNORMAL LOW (ref 4.22–5.81)
RDW: 12.2 % (ref 11.5–15.5)
WBC: 5.3 10*3/uL (ref 4.0–10.5)
nRBC: 0 % (ref 0.0–0.2)

## 2022-08-01 MED ORDER — PREDNISONE 10 MG (21) PO TBPK: ORAL_TABLET | Freq: Every day | ORAL | 0 refills | Status: AC

## 2022-08-01 MED ORDER — GADOBUTROL 1 MMOL/ML IV SOLN
7.0000 mL | Freq: Once | INTRAVENOUS | Status: AC | PRN
  Administered 2022-08-01: 7 mL via INTRAVENOUS

## 2022-08-01 MED ORDER — SODIUM CHLORIDE 0.9 % IV BOLUS
1000.0000 mL | Freq: Once | INTRAVENOUS | Status: AC
  Administered 2022-08-01: 1000 mL via INTRAVENOUS

## 2022-08-01 MED ORDER — CYCLOBENZAPRINE HCL 10 MG PO TABS: 10.0000 mg | ORAL_TABLET | Freq: Two times a day (BID) | ORAL | 0 refills | Status: DC | PRN

## 2022-08-01 MED ORDER — KETOROLAC TROMETHAMINE 15 MG/ML IJ SOLN
15.0000 mg | Freq: Once | INTRAMUSCULAR | Status: AC
  Administered 2022-08-01: 15 mg via INTRAVENOUS
  Filled 2022-08-01: qty 1
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
## 2022-08-01 NOTE — ED Notes (Signed)
Patient transported to MRI 

## 2022-08-01 NOTE — ED Provider Notes (Signed)
Update: Pt w hypotension. We are checking labs and re-checking BP. In NAD   Timothy Gray, Timothy Gray 08/01/22 0316    Orpah Greek, MD 08/01/22 [phone]

## 2022-08-01 NOTE — Discharge Instructions (Signed)
Your MRI today is reassuring and does not show significant spine disease.  This may improve with home exercises, steroids, muscle relaxant. Do not drive, operate machinery, or climb trees while taking the muscle relaxant.  Apply a warm compress to your back for 20 minutes, follow with the exercises provided. Follow up with a primary care provider. If you do not have one, please contact the resources provided.

## 2022-08-06 LAB — CULTURE, BLOOD (SINGLE)
Culture: NO GROWTH
Special Requests: ADEQUATE

## 2022-09-29 ENCOUNTER — Emergency Department (HOSPITAL_COMMUNITY): Payer: BLUE CROSS/BLUE SHIELD

## 2022-09-29 ENCOUNTER — Encounter (HOSPITAL_COMMUNITY): Payer: Self-pay

## 2022-09-29 ENCOUNTER — Emergency Department (HOSPITAL_COMMUNITY)
Admission: EM | Admit: 2022-09-29 | Discharge: 2022-09-29 | Disposition: A | Discharge status: Home / Self Care | Payer: BLUE CROSS/BLUE SHIELD | Attending: Emergency Medicine | Admitting: Emergency Medicine

## 2022-09-29 DIAGNOSIS — S29019A Strain of muscle and tendon of unspecified wall of thorax, initial encounter: Secondary | ICD-10-CM | POA: Insufficient documentation

## 2022-09-29 DIAGNOSIS — X58XXXA Exposure to other specified factors, initial encounter: Secondary | ICD-10-CM | POA: Insufficient documentation

## 2022-09-29 DIAGNOSIS — S39012A Strain of muscle, fascia and tendon of lower back, initial encounter: Secondary | ICD-10-CM | POA: Insufficient documentation

## 2022-09-29 DIAGNOSIS — M545 Low back pain, unspecified: Secondary | ICD-10-CM | POA: Diagnosis present

## 2022-09-29 DIAGNOSIS — S0990XA Unspecified injury of head, initial encounter: Secondary | ICD-10-CM | POA: Insufficient documentation

## 2022-09-29 DIAGNOSIS — W19XXXA Unspecified fall, initial encounter: Secondary | ICD-10-CM

## 2022-09-29 LAB — URINALYSIS, ROUTINE W REFLEX MICROSCOPIC
Bilirubin Urine: NEGATIVE
Glucose, UA: NEGATIVE mg/dL
Hgb urine dipstick: NEGATIVE
Ketones, ur: NEGATIVE mg/dL
Leukocytes,Ua: NEGATIVE
Nitrite: NEGATIVE
Protein, ur: NEGATIVE mg/dL
Specific Gravity, Urine: 1.006 (ref 1.005–1.030)
pH: 7 (ref 5.0–8.0)

## 2022-09-29 LAB — COMPREHENSIVE METABOLIC PANEL
ALT: 78 U/L — ABNORMAL HIGH (ref 0–44)
AST: 65 U/L — ABNORMAL HIGH (ref 15–41)
Albumin: 4 g/dL (ref 3.5–5.0)
Alkaline Phosphatase: 50 U/L (ref 38–126)
Anion gap: 10 (ref 5–15)
BUN: 12 mg/dL (ref 6–20)
CO2: 27 mmol/L (ref 22–32)
Calcium: 9.3 mg/dL (ref 8.9–10.3)
Chloride: 99 mmol/L (ref 98–111)
Creatinine, Ser: 0.86 mg/dL (ref 0.61–1.24)
GFR, Estimated: >60 mL/min (ref >60)
Glucose, Bld: 115 mg/dL — ABNORMAL HIGH (ref 70–99)
Potassium: 4.2 mmol/L (ref 3.5–5.1)
Sodium: 136 mmol/L (ref 135–145)
Total Bilirubin: 0.5 mg/dL (ref 0.3–1.2)
Total Protein: 8 g/dL (ref 6.5–8.1)

## 2022-09-29 LAB — CBC WITH DIFFERENTIAL/PLATELET
Abs Immature Granulocytes: 0.02 10*3/uL (ref 0.00–0.07)
Basophils Absolute: 0.1 10*3/uL (ref 0.0–0.1)
Basophils Relative: 1 %
Eosinophils Absolute: 0.1 10*3/uL (ref 0.0–0.5)
Eosinophils Relative: 2 %
HCT: 37.9 % — ABNORMAL LOW (ref 39.0–52.0)
Hemoglobin: 12.8 g/dL — ABNORMAL LOW (ref 13.0–17.0)
Immature Granulocytes: 0 %
Lymphocytes Relative: 29 %
Lymphs Abs: 2.1 10*3/uL (ref 0.7–4.0)
MCH: 28.8 pg (ref 26.0–34.0)
MCHC: 33.8 g/dL (ref 30.0–36.0)
MCV: 85.4 fL (ref 80.0–100.0)
Monocytes Absolute: 0.4 10*3/uL (ref 0.1–1.0)
Monocytes Relative: 6 %
Neutro Abs: 4.5 10*3/uL (ref 1.7–7.7)
Neutrophils Relative %: 62 %
Platelets: 212 10*3/uL (ref 150–400)
RBC: 4.44 MIL/uL (ref 4.22–5.81)
RDW: 11.9 % (ref 11.5–15.5)
WBC: 7.1 10*3/uL (ref 4.0–10.5)
nRBC: 0 % (ref 0.0–0.2)

## 2022-09-29 LAB — I-STAT CHEM 8, ED
BUN: 13 mg/dL (ref 6–20)
Calcium, Ion: 1.12 mmol/L — ABNORMAL LOW (ref 1.15–1.40)
Chloride: 100 mmol/L (ref 98–111)
Creatinine, Ser: 0.8 mg/dL (ref 0.61–1.24)
Glucose, Bld: 114 mg/dL — ABNORMAL HIGH (ref 70–99)
HCT: 37 % — ABNORMAL LOW (ref 39.0–52.0)
Hemoglobin: 12.6 g/dL — ABNORMAL LOW (ref 13.0–17.0)
Potassium: 4.3 mmol/L (ref 3.5–5.1)
Sodium: 139 mmol/L (ref 135–145)
TCO2: 29 mmol/L (ref 22–32)

## 2022-09-29 MED ORDER — IOHEXOL 350 MG/ML SOLN
75.0000 mL | Freq: Once | INTRAVENOUS | Status: AC | PRN
  Administered 2022-09-29: 75 mL via INTRAVENOUS

## 2022-09-29 MED ORDER — DIAZEPAM 5 MG PO TABS: 5.0000 mg | ORAL_TABLET | Freq: Two times a day (BID) | ORAL | 0 refills | Status: AC | PRN

## 2022-09-29 MED ORDER — KETOROLAC TROMETHAMINE 30 MG/ML IJ SOLN
30.0000 mg | Freq: Once | INTRAMUSCULAR | Status: AC
  Administered 2022-09-29: 30 mg via INTRAVENOUS
  Filled 2022-09-29: qty 1

## 2022-09-29 MED ORDER — HYDROCODONE-ACETAMINOPHEN 5-325 MG PO TABS: 1.0000 | ORAL_TABLET | ORAL | 0 refills | Status: AC | PRN

## 2022-09-29 MED ORDER — ONDANSETRON HCL 4 MG/2ML IJ SOLN
4.0000 mg | Freq: Once | INTRAMUSCULAR | Status: AC
  Administered 2022-09-29: 4 mg via INTRAVENOUS
  Filled 2022-09-29: qty 2

## 2022-09-29 MED ORDER — MORPHINE SULFATE (PF) 4 MG/ML IV SOLN
4.0000 mg | Freq: Once | INTRAVENOUS | Status: AC
  Administered 2022-09-29: 4 mg via INTRAVENOUS
  Filled 2022-09-29: qty 1

## 2022-09-29 MED ORDER — IBUPROFEN 600 MG PO TABS: 600.0000 mg | ORAL_TABLET | Freq: Four times a day (QID) | ORAL | 0 refills | Status: DC | PRN

## 2022-09-29 NOTE — ED Triage Notes (Signed)
Pt trimming tree yesterday evening around 6 pm; fell approx 8 feet, landed on right back; endorses hitting head, denies loc; c/o mid-low back, R shoulder pain; pt a and o x 4, ambulatory on arrival to ED; not on thinners

## 2022-09-29 NOTE — ED Notes (Signed)
Patient verbalizes understanding of discharge instructions. Opportunity for questioning and answers were provided. Pt discharged from ED. 

## 2022-09-29 NOTE — ED Notes (Signed)
Pt resting with eyes closed; respirations spontaneous, even, unlabored 

## 2022-09-29 NOTE — ED Provider Notes (Signed)
[facility] Provider Note   CSN: [id number] Arrival date & time: 09/29/22  [id number]     History  Chief Complaint  Patient presents with   [name]    Timothy Gray is a 37 y.o. male.  Pt is a 37 yo male with no significant pmhx.  Pt said he fell out of a tree last night around 1800.  Pt said he fell about 8 feet.  He mainly has back pain.  Pt does not think he hit his head.  No loc.  He thought it would get better overnight, but it is worse.  He was unable to sleep due to the pain.       Home Medications Prior to Admission medications   Medication Sig Start Date End Date Taking? Authorizing Provider  diazepam (VALIUM) 5 MG tablet Take 1 tablet (5 mg total) by mouth 2 (two) times daily as needed for anxiety. 09/29/22  Yes Isla Pence, MD  HYDROcodone-acetaminophen (NORCO/VICODIN) 5-325 MG tablet Take 1 tablet by mouth every 4 (four) hours as needed. 09/29/22  Yes Isla Pence, MD  ibuprofen (ADVIL) 600 MG tablet Take 1 tablet (600 mg total) by mouth every 6 (six) hours as needed. 09/29/22  Yes Isla Pence, MD  cyclobenzaprine (FLEXERIL) 10 MG tablet Take 1 tablet (10 mg total) by mouth 2 (two) times daily as needed for muscle spasms. 08/01/22   Tacy Learn, PA-C  nicotine polacrilex (NICORETTE) 2 MG gum Take 1 each (2 mg total) by mouth as needed for smoking cessation. 09/08/21   Ntuen, Kris Hartmann, FNP  predniSONE (STERAPRED UNI-PAK 21 TAB) 10 MG (21) TBPK tablet Take by mouth daily. Take 6 tabs by mouth daily  for 2 days, then 5 tabs for 2 days, then 4 tabs for 2 days, then 3 tabs for 2 days, 2 tabs for 2 days, then 1 tab by mouth daily for 2 days 08/01/22   Tacy Learn, PA-C  traZODone (DESYREL) 100 MG tablet Take 1 tablet (100 mg total) by mouth at bedtime as needed for sleep. 09/08/21   Laretta Bolster, FNP      Allergies    Patient has no known allergies.    Review of Systems   Review of Systems  Musculoskeletal:  Positive for  back pain.  All other systems reviewed and are negative.   Physical Exam Updated Vital Signs BP 119/75   Pulse 69   Temp 98.2 F (36.8 C)   Resp 18   Ht 5\' 10"  (1.778 m)   Wt 65.8 kg   SpO2 100%   BMI 20.81 kg/m  Physical Exam Vitals and nursing note reviewed.  Constitutional:      Appearance: Normal appearance.  HENT:     Head: Normocephalic and atraumatic.     Right Ear: External ear normal.     Left Ear: External ear normal.     Nose: Nose normal.     Mouth/Throat:     Mouth: Mucous membranes are moist.     Pharynx: Oropharynx is clear.  Eyes:     Extraocular Movements: Extraocular movements intact.     Conjunctiva/sclera: Conjunctivae normal.     Pupils: Pupils are equal, round, and reactive to light.  Cardiovascular:     Rate and Rhythm: Normal rate and regular rhythm.     Pulses: Normal pulses.     Heart sounds: Normal heart sounds.  Pulmonary:     Effort: Pulmonary effort is normal.  Breath sounds: Normal breath sounds.  Abdominal:     General: Abdomen is flat. Bowel sounds are normal.     Palpations: Abdomen is soft.  Musculoskeletal:        General: Normal range of motion.       Arms:     Cervical back: Normal range of motion and neck supple.  Skin:    General: Skin is warm.     Capillary Refill: Capillary refill takes less than 2 seconds.  Neurological:     General: No focal deficit present.     Mental Status: He is alert and oriented to person, place, and time.  Psychiatric:        Mood and Affect: Mood normal.        Behavior: Behavior normal.     ED Results / Procedures / Treatments   Labs (all labs ordered are listed, but only abnormal results are displayed) Labs Reviewed  CBC WITH DIFFERENTIAL/PLATELET - Abnormal; Notable for the following components:      Result Value   Hemoglobin 12.8 (*)    HCT 37.9 (*)    All other components within normal limits  COMPREHENSIVE METABOLIC PANEL - Abnormal; Notable for the following components:    Glucose, Bld 115 (*)    AST 65 (*)    ALT 78 (*)    All other components within normal limits  URINALYSIS, ROUTINE W REFLEX MICROSCOPIC - Abnormal; Notable for the following components:   Color, Urine STRAW (*)    All other components within normal limits  I-STAT CHEM 8, ED - Abnormal; Notable for the following components:   Glucose, Bld 114 (*)    Calcium, Ion 1.12 (*)    Hemoglobin 12.6 (*)    HCT 37.0 (*)    All other components within normal limits    EKG None  Radiology CT T-SPINE NO CHARGE  Result Date: 09/29/2022 CLINICAL DATA:  Trauma EXAM: CT Thoracic and Lumbar spine with contrast TECHNIQUE: Multiplanar CT images of the thoracic and lumbar spine were reconstructed from contemporary CT of the Chest, Abdomen, and Pelvis. RADIATION DOSE REDUCTION: This exam was performed according to the departmental dose-optimization program which includes automated exposure control, adjustment of the mA and/or kV according to patient size and/or use of iterative reconstruction technique. COMPARISON:  Lumbar spine radiographs 1 day prior, thoracic spine radiographs 12/26/2018 FINDINGS: CT THORACIC SPINE FINDINGS Alignment: Normal. Vertebrae: Vertebral body heights are preserved. There is no evidence of acute fracture. There is no suspicious osseous lesion. Paraspinal and other soft tissues: The paraspinal soft tissues are unremarkable. The heart and lungs are assessed on the separately dictated CT chest. Disc levels: The disc heights are overall preserved. There is no significant osseous spinal canal or neural foraminal stenosis. CT LUMBAR SPINE FINDINGS Segmentation: There is transitional anatomy with lumbarization of the S1 vertebral body. For the purposes of this report, the lowest disc space which is somewhat rudimentary is designated S1-S2. Alignment: Normal. Vertebrae: Vertebral body heights are preserved. There is no evidence of acute fracture. There is no suspicious osseous lesion. Paraspinal  and other soft tissues: The paraspinal soft tissues are unremarkable. The abdominal and pelvic viscera are assessed on the separately dictated CT abdomen/pelvis. Disc levels: The disc heights are preserved. There is no significant disc herniation. There is no significant spinal canal or neural foraminal stenosis. IMPRESSION: No acute fracture or traumatic malalignment of the thoracic or lumbar spine. Electronically Signed   By: Valetta Mole M.D.   On:  09/29/2022 11:16   CT L-SPINE NO CHARGE  Result Date: 09/29/2022 CLINICAL DATA:  Trauma EXAM: CT Thoracic and Lumbar spine with contrast TECHNIQUE: Multiplanar CT images of the thoracic and lumbar spine were reconstructed from contemporary CT of the Chest, Abdomen, and Pelvis. RADIATION DOSE REDUCTION: This exam was performed according to the departmental dose-optimization program which includes automated exposure control, adjustment of the mA and/or kV according to patient size and/or use of iterative reconstruction technique. COMPARISON:  Lumbar spine radiographs 1 day prior, thoracic spine radiographs 12/26/2018 FINDINGS: CT THORACIC SPINE FINDINGS Alignment: Normal. Vertebrae: Vertebral body heights are preserved. There is no evidence of acute fracture. There is no suspicious osseous lesion. Paraspinal and other soft tissues: The paraspinal soft tissues are unremarkable. The heart and lungs are assessed on the separately dictated CT chest. Disc levels: The disc heights are overall preserved. There is no significant osseous spinal canal or neural foraminal stenosis. CT LUMBAR SPINE FINDINGS Segmentation: There is transitional anatomy with lumbarization of the S1 vertebral body. For the purposes of this report, the lowest disc space which is somewhat rudimentary is designated S1-S2. Alignment: Normal. Vertebrae: Vertebral body heights are preserved. There is no evidence of acute fracture. There is no suspicious osseous lesion. Paraspinal and other soft tissues:  The paraspinal soft tissues are unremarkable. The abdominal and pelvic viscera are assessed on the separately dictated CT abdomen/pelvis. Disc levels: The disc heights are preserved. There is no significant disc herniation. There is no significant spinal canal or neural foraminal stenosis. IMPRESSION: No acute fracture or traumatic malalignment of the thoracic or lumbar spine. Electronically Signed   By: Valetta Mole M.D.   On: 09/29/2022 11:16   CT Head Wo Contrast  Result Date: 09/29/2022 CLINICAL DATA:  Trauma EXAM: CT HEAD WITHOUT CONTRAST CT CERVICAL SPINE WITHOUT CONTRAST TECHNIQUE: Multidetector CT imaging of the head and cervical spine was performed following the standard protocol without intravenous contrast. Multiplanar CT image reconstructions of the cervical spine were also generated. RADIATION DOSE REDUCTION: This exam was performed according to the departmental dose-optimization program which includes automated exposure control, adjustment of the mA and/or kV according to patient size and/or use of iterative reconstruction technique. COMPARISON:  CT head and brain MRI 04/01/2022, CT cervical spine 06/28/2013 FINDINGS: CT HEAD FINDINGS Brain: There is no acute intracranial hemorrhage, extra-axial fluid collection, or acute infarct. Parenchymal volume is normal. The ventricles are normal in size. Gray-white differentiation is preserved. There is no mass lesion. There is no mass effect or midline shift. The pituitary and suprasellar region are normal. Vascular: No hyperdense vessel or unexpected calcification. Skull: Normal. Negative for fracture or focal lesion. Sinuses/Orbits: The imaged paranasal sinuses are clear. The globes are intact. Postsurgical changes are noted along the right orbital floor common bilateral maxillary sinus anterior walls, and right nasal bone. Other: None. CT CERVICAL SPINE FINDINGS Alignment: Normal. Skull base and vertebrae: Skull base alignment is maintained. Vertebral  body heights are preserved. There is no acute fracture. There is no suspicious osseous lesion. Soft tissues and spinal canal: No prevertebral fluid or swelling. No visible canal hematoma. Disc levels: There is mild facet arthropathy on the left C4-C5 and mild degenerative endplate change [id number]. There is no significant spinal canal or neural foraminal stenosis. Upper chest: Assessed on the separately dictated CT chest. Other: None. IMPRESSION: 1. No acute intracranial pathology. 2. No acute fracture or traumatic malalignment of the cervical spine. Electronically Signed   By: Valetta Mole M.D.   On: 09/29/2022  11:11   CT Cervical Spine Wo Contrast  Result Date: 09/29/2022 CLINICAL DATA:  Trauma EXAM: CT HEAD WITHOUT CONTRAST CT CERVICAL SPINE WITHOUT CONTRAST TECHNIQUE: Multidetector CT imaging of the head and cervical spine was performed following the standard protocol without intravenous contrast. Multiplanar CT image reconstructions of the cervical spine were also generated. RADIATION DOSE REDUCTION: This exam was performed according to the departmental dose-optimization program which includes automated exposure control, adjustment of the mA and/or kV according to patient size and/or use of iterative reconstruction technique. COMPARISON:  CT head and brain MRI 04/01/2022, CT cervical spine 06/28/2013 FINDINGS: CT HEAD FINDINGS Brain: There is no acute intracranial hemorrhage, extra-axial fluid collection, or acute infarct. Parenchymal volume is normal. The ventricles are normal in size. Gray-white differentiation is preserved. There is no mass lesion. There is no mass effect or midline shift. The pituitary and suprasellar region are normal. Vascular: No hyperdense vessel or unexpected calcification. Skull: Normal. Negative for fracture or focal lesion. Sinuses/Orbits: The imaged paranasal sinuses are clear. The globes are intact. Postsurgical changes are noted along the right orbital floor common bilateral  maxillary sinus anterior walls, and right nasal bone. Other: None. CT CERVICAL SPINE FINDINGS Alignment: Normal. Skull base and vertebrae: Skull base alignment is maintained. Vertebral body heights are preserved. There is no acute fracture. There is no suspicious osseous lesion. Soft tissues and spinal canal: No prevertebral fluid or swelling. No visible canal hematoma. Disc levels: There is mild facet arthropathy on the left C4-C5 and mild degenerative endplate change [id number]. There is no significant spinal canal or neural foraminal stenosis. Upper chest: Assessed on the separately dictated CT chest. Other: None. IMPRESSION: 1. No acute intracranial pathology. 2. No acute fracture or traumatic malalignment of the cervical spine. Electronically Signed   By: Valetta Mole M.D.   On: 09/29/2022 11:11   CT CHEST ABDOMEN PELVIS W CONTRAST  Result Date: 09/29/2022 CLINICAL DATA:  Trauma, fall from proximally 8 feet, low back pain and right shoulder pain EXAM: CT CHEST, ABDOMEN, AND PELVIS WITH CONTRAST TECHNIQUE: Multidetector CT imaging of the chest, abdomen and pelvis was performed following the standard protocol during bolus administration of intravenous contrast. RADIATION DOSE REDUCTION: This exam was performed according to the departmental dose-optimization program which includes automated exposure control, adjustment of the mA and/or kV according to patient size and/or use of iterative reconstruction technique. CONTRAST:  48mL OMNIPAQUE IOHEXOL 350 MG/ML SOLN COMPARISON:  None Available. FINDINGS: CT CHEST FINDINGS Cardiovascular: Normal heart size. No pericardial effusion. Normal caliber thoracic aorta with no atherosclerotic disease. Mediastinum/Nodes: Esophagus and thyroid are unremarkable. No enlarged lymph nodes seen in the chest. Lungs/Pleura: Central airways are patent. Mild bibasilar ground-glass opacities which are likely due to atelectasis. Solid pulmonary nodule of the right lower lobe measuring 4 mm  on series 4, image 92. Musculoskeletal: No acute osseous abnormality. CT ABDOMEN PELVIS FINDINGS Hepatobiliary: No hepatic injury or perihepatic hematoma, although streak artifact somewhat limits evaluation. Gallstone with no gallbladder wall thickening. Pancreas: Unremarkable. No pancreatic ductal dilatation or surrounding inflammatory changes. Spleen: No splenic injury or perisplenic hematoma, although streak artifact somewhat limits evaluation. Adrenals/Urinary Tract: No adrenal hemorrhage or renal injury identified. Bladder is unremarkable. Upper pole left renal stone with associated focal caliectasis. Stomach/Bowel: Stomach is within normal limits. No evidence of bowel wall thickening, distention, or inflammatory changes. Vascular/Lymphatic: No significant vascular findings are present. No enlarged abdominal or pelvic lymph nodes. Reproductive: Prostate is unremarkable. Other: No abdominal wall hernia or abnormality. No abdominopelvic ascites.  Musculoskeletal: No acute or significant osseous findings. IMPRESSION: 1. No evidence of acute traumatic injury of the chest, abdomen or pelvis. 2. Upper pole left renal stone with associated focal caliectasis. 3. Solid pulmonary nodule of the right lower lobe measuring 4 mm. No follow-up needed if patient is low-risk.This recommendation follows the consensus statement: Guidelines for Management of Incidental Pulmonary Nodules Detected on CT Images: From the Fleischner Society 2017; Radiology 2017; 284:228-243. Electronically Signed   By: Yetta Glassman M.D.   On: 09/29/2022 11:08   DG Chest Portable 1 View  Result Date: 09/29/2022 CLINICAL DATA:  Fall, back pain EXAM: PORTABLE CHEST 1 VIEW COMPARISON:  03/10/2019 FINDINGS: The heart size and mediastinal contours are within normal limits. Both lungs are clear. No pneumothorax. The visualized skeletal structures are unremarkable. IMPRESSION: No acute findings within the chest. Electronically Signed   By: Davina Poke D.O.   On: 09/29/2022 10:12    Procedures Procedures    Medications Ordered in ED Medications  ketorolac (TORADOL) 30 MG/ML injection 30 mg (has no administration in time range)  morphine (PF) 4 MG/ML injection 4 mg (4 mg Intravenous Given 09/29/22 0945)  ondansetron (ZOFRAN) injection 4 mg (4 mg Intravenous Given 09/29/22 0945)  iohexol (OMNIPAQUE) 350 MG/ML injection 75 mL (75 mLs Intravenous Contrast Given 09/29/22 1053)    ED Course/ Medical Decision Making/ A&P                             Medical Decision Making Amount and/or Complexity of Data Reviewed Labs: ordered. Radiology: ordered.  Risk Prescription drug management.   This patient presents to the ED for concern of fall, this involves an extensive number of treatment options, and is a complaint that carries with it a high risk of complications and morbidity.  The differential diagnosis includes multiple trauma   Co morbidities that complicate the patient evaluation  none   Additional history obtained:  Additional history obtained from epic chart review External records from outside source obtained and reviewed including family   Lab Tests:  I Ordered, and personally interpreted labs.  The pertinent results include:  ua nl, cmp nl, cbc nl   Imaging Studies ordered:  I ordered imaging studies including cxr, ct head/c-spine/chest/abd/pelvis/thoracic and lumbar ct  I independently visualized and interpreted imaging which showed  CXR: No acute findings within the chest.  CT head/c-spine:  No acute intracranial pathology.  2. No acute fracture or traumatic malalignment of the cervical  spine.  CT chest/abd/pelvis:  No evidence of acute traumatic injury of the chest, abdomen or  pelvis.  2. Upper pole left renal stone with associated focal caliectasis.  3. Solid pulmonary nodule of the right lower lobe measuring 4 mm. No  follow-up needed if patient is low-risk.This recommendation follows  the  consensus statement: Guidelines for Management of Incidental  Pulmonary Nodules Detected on CT Images: From the Fleischner Society  2017; Radiology 2017; 284:228-243.  CT thoracic and l-spine: No acute fracture or traumatic malalignment of the thoracic or  lumbar spine.    I agree with the radiologist interpretation   Cardiac Monitoring:  The patient was maintained on a cardiac monitor.  I personally viewed and interpreted the cardiac monitored which showed an underlying rhythm of: nsr   Medicines ordered and prescription drug management:  I ordered medication including morphine/zofran  for sx  Reevaluation of the patient after these medicines showed that the patient improved I have  reviewed the patients home medicines and have made adjustments as needed   Test Considered:  ct   Critical Interventions:  Pain control    Problem List / ED Course:  Fall:  luckily, no fx.  Pt is able to ambulate, but it does hurt to ambulate.  Pain has improved.  Pt is stable for d/c.  Return if worse.    Reevaluation:  After the interventions noted above, I reevaluated the patient and found that they have :improved   Social Determinants of Health:  Lives at home   Dispostion:  After consideration of the diagnostic results and the patients response to treatment, I feel that the patent would benefit from discharge with outpatient f/u.          Final Clinical Impression(s) / ED Diagnoses Final diagnoses:  Fall, initial encounter  Thoracic myofascial strain, initial encounter  Strain of lumbar region, initial encounter    Rx / DC Orders ED Discharge Orders          Ordered    HYDROcodone-acetaminophen (NORCO/VICODIN) 5-325 MG tablet  Every 4 hours PRN        09/29/22 1127    ibuprofen (ADVIL) 600 MG tablet  Every 6 hours PRN        09/29/22 1127    diazepam (VALIUM) 5 MG tablet  2 times daily PRN        09/29/22 1127              Isla Pence,  MD 09/29/22 1128

## 2023-03-08 ENCOUNTER — Other Ambulatory Visit: Payer: Self-pay

## 2023-03-08 ENCOUNTER — Emergency Department (HOSPITAL_COMMUNITY): Payer: BLUE CROSS/BLUE SHIELD

## 2023-03-08 ENCOUNTER — Encounter (HOSPITAL_COMMUNITY): Payer: Self-pay

## 2023-03-08 ENCOUNTER — Emergency Department (HOSPITAL_COMMUNITY)
Admission: EM | Admit: 2023-03-08 | Discharge: 2023-03-08 | Disposition: A | Discharge status: Home / Self Care | Payer: BLUE CROSS/BLUE SHIELD | Attending: Emergency Medicine | Admitting: Emergency Medicine

## 2023-03-08 DIAGNOSIS — M25552 Pain in left hip: Secondary | ICD-10-CM

## 2023-03-08 DIAGNOSIS — S7012XA Contusion of left thigh, initial encounter: Secondary | ICD-10-CM | POA: Diagnosis not present

## 2023-03-08 DIAGNOSIS — Y92410 Unspecified street and highway as the place of occurrence of the external cause: Secondary | ICD-10-CM | POA: Diagnosis not present

## 2023-03-08 DIAGNOSIS — Y9355 Activity, bike riding: Secondary | ICD-10-CM | POA: Insufficient documentation

## 2023-03-08 DIAGNOSIS — M20011 Mallet finger of right finger(s): Secondary | ICD-10-CM | POA: Diagnosis not present

## 2023-03-08 NOTE — Discharge Instructions (Addendum)
Return for any problem.   Follow up with Hand specialist as instructed.
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
## 2023-03-19 ENCOUNTER — Emergency Department (HOSPITAL_COMMUNITY)
Admission: EM | Admit: 2023-03-19 | Discharge: 2023-03-19 | Disposition: A | Discharge status: Home / Self Care | Payer: BLUE CROSS/BLUE SHIELD

## 2023-03-19 ENCOUNTER — Emergency Department (HOSPITAL_COMMUNITY): Payer: BLUE CROSS/BLUE SHIELD

## 2023-03-19 ENCOUNTER — Other Ambulatory Visit: Payer: Self-pay

## 2023-03-19 ENCOUNTER — Encounter (HOSPITAL_COMMUNITY): Payer: Self-pay

## 2023-03-19 DIAGNOSIS — M545 Low back pain, unspecified: Secondary | ICD-10-CM | POA: Diagnosis present

## 2023-03-19 DIAGNOSIS — W108XXA Fall (on) (from) other stairs and steps, initial encounter: Secondary | ICD-10-CM | POA: Insufficient documentation

## 2023-03-19 DIAGNOSIS — G8929 Other chronic pain: Secondary | ICD-10-CM | POA: Insufficient documentation

## 2023-03-19 MED ORDER — LIDOCAINE 5 % EX PTCH
1.0000 | MEDICATED_PATCH | CUTANEOUS | Status: DC
  Administered 2023-03-19: 1 via TRANSDERMAL
  Filled 2023-03-19: qty 1

## 2023-03-19 MED ORDER — IBUPROFEN 600 MG PO TABS: 600.0000 mg | ORAL_TABLET | Freq: Four times a day (QID) | ORAL | 0 refills | Status: AC | PRN

## 2023-03-19 MED ORDER — OXYCODONE HCL 5 MG PO TABS
5.0000 mg | ORAL_TABLET | ORAL | Status: AC
  Administered 2023-03-19: 5 mg via ORAL
  Filled 2023-03-19: qty 1

## 2023-03-19 MED ORDER — KETOROLAC TROMETHAMINE 15 MG/ML IJ SOLN
15.0000 mg | Freq: Once | INTRAMUSCULAR | Status: AC
  Administered 2023-03-19: 15 mg via INTRAMUSCULAR
  Filled 2023-03-19: qty 1

## 2023-03-19 MED ORDER — CYCLOBENZAPRINE HCL 10 MG PO TABS: 10.0000 mg | ORAL_TABLET | Freq: Two times a day (BID) | ORAL | 0 refills | Status: AC | PRN

## 2023-03-19 NOTE — ED Provider Notes (Signed)
[place] EMERGENCY DEPARTMENT AT [facility] Provider Note   CSN: [id number] Arrival date & time: 03/19/23  1338     History No chief complaint on file.   Timothy Gray is a 37 y.o. male.  HPI     Home Medications Prior to Admission medications   Medication Sig Start Date End Date Taking? Authorizing Provider  cyclobenzaprine (FLEXERIL) 10 MG tablet Take 1 tablet (10 mg total) by mouth 2 (two) times daily as needed for muscle spasms. 03/19/23   Achille Rich, PA-C  diazepam (VALIUM) 5 MG tablet Take 1 tablet (5 mg total) by mouth 2 (two) times daily as needed for anxiety. 09/29/22   Jacalyn Lefevre, MD  HYDROcodone-acetaminophen (NORCO/VICODIN) 5-325 MG tablet Take 1 tablet by mouth every 4 (four) hours as needed. 09/29/22   Jacalyn Lefevre, MD  ibuprofen (ADVIL) 600 MG tablet Take 1 tablet (600 mg total) by mouth every 6 (six) hours as needed. 03/19/23   Achille Rich, PA-C  nicotine polacrilex (NICORETTE) 2 MG gum Take 1 each (2 mg total) by mouth as needed for smoking cessation. 09/08/21   Ntuen, Jesusita Oka, FNP  predniSONE (STERAPRED UNI-PAK 21 TAB) 10 MG (21) TBPK tablet Take by mouth daily. Take 6 tabs by mouth daily  for 2 days, then 5 tabs for 2 days, then 4 tabs for 2 days, then 3 tabs for 2 days, 2 tabs for 2 days, then 1 tab by mouth daily for 2 days 08/01/22   Jeannie Fend, PA-C  traZODone (DESYREL) 100 MG tablet Take 1 tablet (100 mg total) by mouth at bedtime as needed for sleep. 09/08/21   Cecilie Lowers, FNP      Allergies    Patient has no known allergies.    Review of Systems   Review of Systems  Physical Exam Updated Vital Signs BP 114/74   Pulse (!) 55   Temp 98.3 F (36.8 C) (Oral)   Resp 16   Ht 5\' 10"  (1.778 m)   Wt 65.8 kg   SpO2 99%   BMI 20.81 kg/m  Physical Exam  ED Results / Procedures / Treatments   Labs (all labs ordered are listed, but only abnormal results are displayed) Labs Reviewed - No data to  display  EKG None  Radiology CT Lumbar Spine Wo Contrast  Result Date: 03/19/2023 CLINICAL DATA:  Back trauma, no prior imaging (Age >= 16y) EXAM: CT LUMBAR SPINE WITHOUT CONTRAST TECHNIQUE: Multidetector CT imaging of the lumbar spine was performed without intravenous contrast administration. Multiplanar CT image reconstructions were also generated. RADIATION DOSE REDUCTION: This exam was performed according to the departmental dose-optimization program which includes automated exposure control, adjustment of the mA and/or kV according to patient size and/or use of iterative reconstruction technique. COMPARISON:  CT L Spine 09/29/22 FINDINGS: Segmentation: Lumbarization of S1. Last well formed disc space is labeled S1-S2. Alignment: Normal. Vertebrae: No acute fracture or focal pathologic process. Paraspinal and other soft tissues: Negative. Disc levels: No evidence of high-grade spinal canal stenosis. IMPRESSION: No acute fracture or traumatic listhesis. Electronically Signed   By: Lorenza Cambridge M.D.   On: 03/19/2023 18:51   CT Head Wo Contrast  Result Date: 03/19/2023 CLINICAL DATA:  Head trauma, moderate-severe; Neck trauma, dangerous injury mechanism (Age 73-64y) EXAM: CT HEAD WITHOUT CONTRAST CT CERVICAL SPINE WITHOUT CONTRAST TECHNIQUE: Multidetector CT imaging of the head and cervical spine was performed following the standard protocol without intravenous contrast. Multiplanar CT image reconstructions of the  cervical spine were also generated. RADIATION DOSE REDUCTION: This exam was performed according to the departmental dose-optimization program which includes automated exposure control, adjustment of the mA and/or kV according to patient size and/or use of iterative reconstruction technique. COMPARISON:  None Available. FINDINGS: CT HEAD FINDINGS Brain: No evidence of acute infarction, hemorrhage, hydrocephalus, extra-axial collection or mass lesion/mass effect. Vascular: No hyperdense vessel or  unexpected calcification. Skull: Normal. Negative for fracture or focal lesion. Sinuses/Orbits: No middle ear or mastoid effusion. Paranasal sinuses are clear. Prior complex facial bone reconstruction. Other: None. CT CERVICAL SPINE FINDINGS Alignment: Normal. Skull base and vertebrae: No acute fracture. No primary bone lesion or focal pathologic process. Soft tissues and spinal canal: No prevertebral fluid or swelling. No visible canal hematoma. Disc levels:  No evidence of high-grade spinal canal stenosis. Upper chest: Negative. Other: None IMPRESSION: 1. No acute intracranial abnormality. 2. No acute fracture or traumatic malalignment of the cervical spine. Electronically Signed   By: Lorenza Cambridge M.D.   On: 03/19/2023 18:35   CT Cervical Spine Wo Contrast  Result Date: 03/19/2023 CLINICAL DATA:  Head trauma, moderate-severe; Neck trauma, dangerous injury mechanism (Age 40-64y) EXAM: CT HEAD WITHOUT CONTRAST CT CERVICAL SPINE WITHOUT CONTRAST TECHNIQUE: Multidetector CT imaging of the head and cervical spine was performed following the standard protocol without intravenous contrast. Multiplanar CT image reconstructions of the cervical spine were also generated. RADIATION DOSE REDUCTION: This exam was performed according to the departmental dose-optimization program which includes automated exposure control, adjustment of the mA and/or kV according to patient size and/or use of iterative reconstruction technique. COMPARISON:  None Available. FINDINGS: CT HEAD FINDINGS Brain: No evidence of acute infarction, hemorrhage, hydrocephalus, extra-axial collection or mass lesion/mass effect. Vascular: No hyperdense vessel or unexpected calcification. Skull: Normal. Negative for fracture or focal lesion. Sinuses/Orbits: No middle ear or mastoid effusion. Paranasal sinuses are clear. Prior complex facial bone reconstruction. Other: None. CT CERVICAL SPINE FINDINGS Alignment: Normal. Skull base and vertebrae: No acute  fracture. No primary bone lesion or focal pathologic process. Soft tissues and spinal canal: No prevertebral fluid or swelling. No visible canal hematoma. Disc levels:  No evidence of high-grade spinal canal stenosis. Upper chest: Negative. Other: None IMPRESSION: 1. No acute intracranial abnormality. 2. No acute fracture or traumatic malalignment of the cervical spine. Electronically Signed   By: Lorenza Cambridge M.D.   On: 03/19/2023 18:35   DG Thoracic Spine 2 View  Result Date: 03/19/2023 CLINICAL DATA:  Injury, fall down stairs, pain. EXAM: THORACIC SPINE 2 VIEWS COMPARISON:  CT 09/29/2022 FINDINGS: The alignment is maintained. Vertebral body heights are maintained. No evidence of fracture. No significant disc space narrowing. Posterior elements appear intact. There is no paravertebral soft tissue abnormality. IMPRESSION: Negative radiographs of the thoracic spine. Electronically Signed   By: Narda Rutherford M.D.   On: 03/19/2023 15:37   DG Lumbar Spine Complete  Result Date: 03/19/2023 CLINICAL DATA:  Injury, fall down stairs, pain. EXAM: LUMBAR SPINE - COMPLETE 4+ VIEW COMPARISON:  Lumbar spine CT 09/29/2022 FINDINGS: S1 vertebra is lumbarized. No acute fracture. The alignment is normal. Vertebral body heights are normal. The disc spaces are stable. Posterior elements are intact. Incidental non fusion of S1 spinous process. IMPRESSION: No fracture or subluxation of the lumbar spine. Electronically Signed   By: Narda Rutherford M.D.   On: 03/19/2023 15:36   DG Hip Unilat W or Wo Pelvis 2-3 Views Left  Result Date: 03/19/2023 CLINICAL DATA:  Injury, fall down stairs.  Pain. EXAM: DG HIP (WITH OR WITHOUT PELVIS) 2-3V LEFT COMPARISON:  Pelvis and left hip radiographs 03/08/2023, multiple prior exams. FINDINGS: No acute fracture of the pelvis or left hip. No hip dislocation. Pubic symphysis and sacroiliac joints are congruent. IMPRESSION: No fracture of the pelvis or left hip. Electronically Signed   By:  Narda Rutherford M.D.   On: 03/19/2023 15:35    Procedures Procedures  {Document cardiac monitor, telemetry assessment procedure when appropriate:1}  Medications Ordered in ED Medications  lidocaine (LIDODERM) 5 % 1 patch (1 patch Transdermal Patch Applied 03/19/23 1634)  ketorolac (TORADOL) 15 MG/ML injection 15 mg (15 mg Intramuscular Given 03/19/23 1634)  oxyCODONE (Oxy IR/ROXICODONE) immediate release tablet 5 mg (5 mg Oral Given 03/19/23 1634)    ED Course/ Medical Decision Making/ A&P   {   Click here for ABCD2, HEART and other calculatorsREFRESH Note before signing :1}                              Medical Decision Making Amount and/or Complexity of Data Reviewed Radiology: ordered.  Risk Prescription drug management.   ***  {Document critical care time when appropriate:1} {Document review of labs and clinical decision tools ie heart score, Chads2Vasc2 etc:1}  {Document your independent review of radiology images, and any outside records:1} {Document your discussion with family members, caretakers, and with consultants:1} {Document social determinants of health affecting pt's care:1} {Document your decision making why or why not admission, treatments were needed:1} Final Clinical Impression(s) / ED Diagnoses Final diagnoses:  Fall down stairs, initial encounter  Chronic bilateral low back pain without sciatica    Rx / DC Orders ED Discharge Orders          Ordered    cyclobenzaprine (FLEXERIL) 10 MG tablet  2 times daily PRN        03/19/23 1912    ibuprofen (ADVIL) 600 MG tablet  Every 6 hours PRN        03/19/23 1912
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
## 2023-03-19 NOTE — Discharge Instructions (Addendum)
You were seen in the ER today for evaluation of you back pain.  Thankfully, your imaging was unremarkable.  This is likely just musculoskeletal pain.  I am prescribing you a muscle relaxer called Flexeril to take twice a day as needed.  Please do not drive or operate heavy machinery while you are on this medication as it will make you sleepy.  For pain, I recommended 1000 mg of Tylenol and/or 600 mg of ibuprofen every 6 hours as needed for pain.  Can also try over-the-counter lidocaine patches as well.  I would like for you to follow-up with one of our sports medicine specialist for reevaluation.  If you start having any weakness in your arms or legs, worsening back pain, fever, numbness or tingling, trouble walking, please return to your nearest emergency department for reevaluation.  If you have any other concerns, new or worsening symptoms, please return to your nearest emergency department for evaluation.  Contact a health care provider if: You have pain that is not relieved with rest or medicine. You have increasing pain going down into your legs or buttocks. Your pain does not improve after 2 weeks. You have pain at night. You lose weight without trying. You have a fever or chills. You develop nausea or vomiting. You develop abdominal pain. Get help right away if: You develop new bowel or bladder control problems. You have unusual weakness or numbness in your arms or legs. You feel faint. These symptoms may represent a serious problem that is an emergency. Do not wait to see if the symptoms will go away. Get medical help right away. Call your local emergency services (911 in the [place] Do not drive yourself to the hospital.
